# Patient Record
Sex: Male | Born: 2000 | Race: Black or African American | Hispanic: No | Marital: Single | Smoking: Current every day smoker
Health system: Southern US, Community
[De-identification: ages and names within clinical notes are randomized; demographics above are authoritative.]

## PROBLEM LIST (undated history)

## (undated) DIAGNOSIS — F329 Major depressive disorder, single episode, unspecified: Secondary | ICD-10-CM

## (undated) DIAGNOSIS — F32A Depression, unspecified: Secondary | ICD-10-CM

---
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
## 2018-05-02 ENCOUNTER — Emergency Department (HOSPITAL_COMMUNITY): Payer: Medicaid Other

## 2018-05-02 ENCOUNTER — Emergency Department (HOSPITAL_COMMUNITY)
Admission: EM | Admit: 2018-05-02 | Discharge: 2018-05-03 | Disposition: A | Discharge status: Home / Self Care | Payer: Medicaid Other | Attending: Emergency Medicine | Admitting: Emergency Medicine

## 2018-05-02 ENCOUNTER — Other Ambulatory Visit: Payer: Self-pay

## 2018-05-02 ENCOUNTER — Encounter (HOSPITAL_COMMUNITY): Payer: Self-pay | Admitting: Emergency Medicine

## 2018-05-02 DIAGNOSIS — S9032XA Contusion of left foot, initial encounter: Secondary | ICD-10-CM | POA: Insufficient documentation

## 2018-05-02 DIAGNOSIS — Y998 Other external cause status: Secondary | ICD-10-CM | POA: Diagnosis not present

## 2018-05-02 DIAGNOSIS — Y929 Unspecified place or not applicable: Secondary | ICD-10-CM | POA: Diagnosis not present

## 2018-05-02 DIAGNOSIS — S99922A Unspecified injury of left foot, initial encounter: Secondary | ICD-10-CM | POA: Diagnosis present

## 2018-05-02 DIAGNOSIS — Y9367 Activity, basketball: Secondary | ICD-10-CM | POA: Diagnosis not present

## 2018-05-02 DIAGNOSIS — W500XXA Accidental hit or strike by another person, initial encounter: Secondary | ICD-10-CM | POA: Insufficient documentation

## 2018-05-02 NOTE — ED Triage Notes (Signed)
C/o L foot pain.  States someone stepped on his L foot while playing basketball today.

## 2018-05-02 NOTE — Discharge Instructions (Signed)
You will need to alternate Tylenol and ibuprofen to help with your pain. Return to ED for worsening symptoms, red hot or tender joint, injuries or falls, worsening swelling of your foot.

## 2018-05-02 NOTE — ED Provider Notes (Signed)
[facility] EMERGENCY DEPARTMENT Provider Note   CSN: [id number] Arrival date & time: 05/02/18  2251     History   Chief Complaint Chief Complaint  Patient presents with  . Foot Pain    HPI Christopher [name] is a 17 y.o. male who presents to ED for evaluation of left foot pain for the past 10 hours.  States that he was playing basketball when someone stepped on his foot.  He has had pain since then.  Did not take any medicine prior to coming to the ED.  Denies any prior fracture, dislocations or procedures in the area.  He has been ambulatory since then.  Denies any numbness in foot, other injuries.  HPI  History reviewed. No pertinent past medical history.  There are no active problems to display for this patient.   History reviewed. No pertinent surgical history.      Home Medications    Prior to Admission medications   Medication Sig Start Date End Date Taking? Authorizing Provider  HYDROcodone-acetaminophen (NORCO/VICODIN) 5-325 MG tablet Take 1-2 tablets by mouth every 6 (six) hours as needed for severe pain. 06/01/15   Danelle Berry, PA-C  ibuprofen (ADVIL,MOTRIN) 600 MG tablet Take 1 tablet (600 mg total) by mouth every 6 (six) hours as needed. 06/09/17   Elpidio Anis, PA-C  penicillin v potassium (VEETID) 500 MG tablet Take 1 tablet (500 mg total) by mouth 3 (three) times daily. 06/09/17   Elpidio Anis, PA-C    Family History No family history on file.  Social History Social History   Tobacco Use  . Smoking status: Never Smoker  . Smokeless tobacco: Never Used  Substance Use Topics  . Alcohol use: No    Frequency: Never  . Drug use: Yes    Types: Marijuana     Allergies   Patient has no known allergies.   Review of Systems Review of Systems  Constitutional: Negative for chills and fever.  Gastrointestinal: Negative for nausea and vomiting.  Musculoskeletal: Positive for arthralgias.  Skin: Negative for wound.  Neurological:  Negative for weakness and numbness.     Physical Exam Updated Vital Signs BP 131/85 (BP Location: Right Arm)   Pulse 81   Temp 98.6 F (37 C) (Oral)   Resp 15   Ht 6' (1.829 m)   Wt 83.9 kg   SpO2 100%   BMI 25.09 kg/m   Physical Exam  Constitutional: He appears well-developed and well-nourished. No distress.  HENT:  Head: Normocephalic and atraumatic.  Eyes: Conjunctivae and EOM are normal. No scleral icterus.  Neck: Normal range of motion.  Pulmonary/Chest: Effort normal. No respiratory distress.  Musculoskeletal: He exhibits tenderness.       Feet:  Tenderness to palpation of the medial left foot.  Full active and passive range of motion of digits, ankle without difficulty.  No deformity, warmth or erythema of joints noted.  2+ DP pulse palpated.  Sensation intact to light touch of bilateral lower extremities.  Neurological: He is alert.  Skin: No rash noted. He is not diaphoretic.  Psychiatric: He has a normal mood and affect.  Nursing note and vitals reviewed.    ED Treatments / Results  Labs (all labs ordered are listed, but only abnormal results are displayed) Labs Reviewed - No data to display  EKG None  Radiology Dg Foot Complete Left  Result Date: 05/02/2018 CLINICAL DATA:  Left foot pain after injury, someone stepped on foot while playing basketball. Dorsal pain primarily  over first metatarsal. EXAM: LEFT FOOT - COMPLETE 3+ VIEW COMPARISON:  None. FINDINGS: There is no evidence of fracture or dislocation. There is no evidence of arthropathy or other focal bone abnormality. Soft tissues are unremarkable. IMPRESSION: Negative radiographs of the left foot. Electronically Signed   By: Narda Rutherford M.D.   On: 05/02/2018 23:43    Procedures Procedures (including critical care time)  Medications Ordered in ED Medications - No data to display   Initial Impression / Assessment and Plan / ED Course  I have reviewed the triage vital signs and the nursing  notes.  Pertinent labs & imaging results that were available during my care of the patient were reviewed by me and considered in my medical decision making (see chart for details).     17 year old male presents for left foot pain.  He was playing basketball when someone stepped on his foot.  This happened earlier today.  He has been ambulatory since then.  No prior fracture, dislocations or procedures in the area.  Area is neurovascularly intact.  There is tenderness to palpation on the medial left foot.  No skin changes noted.  X-rays negative.  Suspect the symptoms are due to contusion.  Doubt infectious or vascular cause.  Will give Ace wrap, advised alternating Tylenol and ibuprofen.  Advised to return to ED for any severe worsening symptoms.  Portions of this note were generated with Scientist, clinical (histocompatibility and immunogenetics). Dictation errors may occur despite best attempts at proofreading.   Final Clinical Impressions(s) / ED Diagnoses   Final diagnoses:  Contusion of left foot, initial encounter    ED Discharge Orders    None       Dietrich Pates, PA-C 05/03/18 0000    Benjiman Core, MD 05/03/18 [phone]

## 2018-09-17 ENCOUNTER — Emergency Department (HOSPITAL_COMMUNITY)
Admission: EM | Admit: 2018-09-17 | Discharge: 2018-09-17 | Disposition: A | Discharge status: Home / Self Care | Payer: Medicaid Other | Attending: Emergency Medicine | Admitting: Emergency Medicine

## 2018-09-17 ENCOUNTER — Encounter (HOSPITAL_COMMUNITY): Payer: Self-pay | Admitting: Emergency Medicine

## 2018-09-17 ENCOUNTER — Other Ambulatory Visit: Payer: Self-pay

## 2018-09-17 DIAGNOSIS — Z202 Contact with and (suspected) exposure to infections with a predominantly sexual mode of transmission: Secondary | ICD-10-CM | POA: Diagnosis not present

## 2018-09-17 DIAGNOSIS — K0889 Other specified disorders of teeth and supporting structures: Secondary | ICD-10-CM

## 2018-09-17 MED ORDER — CEFTRIAXONE SODIUM 250 MG IJ SOLR
250.0000 mg | Freq: Once | INTRAMUSCULAR | Status: AC
  Administered 2018-09-17: 250 mg via INTRAMUSCULAR
  Filled 2018-09-17: qty 250

## 2018-09-17 MED ORDER — LIDOCAINE HCL (PF) 1 % IJ SOLN
INTRAMUSCULAR | Status: AC
  Administered 2018-09-17: 2 mL
  Filled 2018-09-17: qty 5

## 2018-09-17 MED ORDER — AZITHROMYCIN 1 G PO PACK
1.0000 g | PACK | Freq: Once | ORAL | Status: AC
  Administered 2018-09-17: 1 g via ORAL
  Filled 2018-09-17: qty 1

## 2018-09-17 NOTE — ED Provider Notes (Signed)
[facility] EMERGENCY DEPARTMENT Provider Note   CSN: [id number] Arrival date & time: 09/17/18  1820    History   Chief Complaint Chief Complaint  Patient presents with  . Dental Pain  . SEXUALLY TRANSMITTED DISEASE    HPI Christopher [name] is a 18 y.o. male.     18 year old African-American male with no past medical history presenting to the emergency department for "I think I have an STD".  Patient reports he is in a monogamous relationship with 1 male.  Reports that his girlfriend told him today that she tested positive for chlamydia.  The patient is not having any symptoms.  Patient also reports that he has a broken tooth been bothering him for some time and would like it looked at.  Has not seen dentist.  Denies any fever, chills, abdominal pain, dysuria, hematuria, flank pain, penile discharge.     History reviewed. No pertinent past medical history.  There are no active problems to display for this patient.   History reviewed. No pertinent surgical history.      Home Medications    Prior to Admission medications   Medication Sig Start Date End Date Taking? Authorizing Provider  HYDROcodone-acetaminophen (NORCO/VICODIN) 5-325 MG tablet Take 1-2 tablets by mouth every 6 (six) hours as needed for severe pain. 06/01/15   Danelle Berry, PA-C  ibuprofen (ADVIL,MOTRIN) 600 MG tablet Take 1 tablet (600 mg total) by mouth every 6 (six) hours as needed. 06/09/17   Elpidio Anis, PA-C  penicillin v potassium (VEETID) 500 MG tablet Take 1 tablet (500 mg total) by mouth 3 (three) times daily. 06/09/17   Elpidio Anis, PA-C    Family History No family history on file.  Social History Social History   Tobacco Use  . Smoking status: Never Smoker  . Smokeless tobacco: Never Used  Substance Use Topics  . Alcohol use: No    Frequency: Never  . Drug use: Yes    Types: Marijuana     Allergies   Patient has no known allergies.   Review of  Systems Review of Systems  Constitutional: Negative for chills and fever.  HENT: Positive for dental problem. Negative for ear pain, facial swelling, sore throat and trouble swallowing.   Eyes: Negative for pain and visual disturbance.  Respiratory: Negative for cough and shortness of breath.   Cardiovascular: Negative for chest pain and palpitations.  Gastrointestinal: Negative for abdominal pain and vomiting.  Genitourinary: Negative.  Negative for dysuria and hematuria.  Musculoskeletal: Negative for arthralgias and back pain.  Skin: Negative for color change and rash.  Neurological: Negative for seizures and syncope.  All other systems reviewed and are negative.    Physical Exam Updated Vital Signs BP 139/72 (BP Location: Left Arm)   Pulse 71   Temp 98.4 F (36.9 C) (Oral)   Resp 16   Ht 6\' 1"  (1.854 m)   Wt 81.6 kg   SpO2 100%   BMI 23.75 kg/m   Physical Exam Vitals signs and nursing note reviewed. Exam conducted with a chaperone present.  Constitutional:      Appearance: Normal appearance.  HENT:     Head: Normocephalic.     Mouth/Throat:     Mouth: Mucous membranes are moist.     Pharynx: Oropharynx is clear. No oropharyngeal exudate.     Comments: Poor dentition throughout Eyes:     Conjunctiva/sclera: Conjunctivae normal.  Pulmonary:     Effort: Pulmonary effort is normal.  Skin:  General: Skin is dry.  Neurological:     Mental Status: He is alert.  Psychiatric:        Mood and Affect: Mood normal.      ED Treatments / Results  Labs (all labs ordered are listed, but only abnormal results are displayed) Labs Reviewed  GC/CHLAMYDIA PROBE AMP ([place]) NOT AT [facility]    EKG None  Radiology No results found.  Procedures Procedures (including critical care time)  Medications Ordered in ED Medications  cefTRIAXone (ROCEPHIN) injection 250 mg (has no administration in time range)  azithromycin (ZITHROMAX) powder 1 g (has no administration  in time range)     Initial Impression / Assessment and Plan / ED Course  I have reviewed the triage vital signs and the nursing notes.  Pertinent labs & imaging results that were available during my care of the patient were reviewed by me and considered in my medical decision making (see chart for details).          Final Clinical Impressions(s) / ED Diagnoses   Final diagnoses:  STD exposure  Pain, dental    ED Discharge Orders    None       Jeral Pinch 09/17/18 2219    Margarita Grizzle, MD 09/25/18 1705

## 2018-09-17 NOTE — Discharge Instructions (Addendum)
No sexual activity until you have been retested and your STD tests are negative. Thank you for allowing me to care for you today. Please return to the emergency department if you have new or worsening symptoms. Take your medications as instructed.

## 2018-09-17 NOTE — ED Triage Notes (Signed)
Pt. Stated, My girl friend said she had chlamydia and I also have had mouth swelling, broken tooth bottom left, for a couple of months

## 2018-09-17 NOTE — ED Notes (Signed)
Patient verbalizes understanding of discharge instructions. Opportunity for questioning and answers were provided. Armband removed by staff, pt discharged from ED.  

## 2018-09-18 LAB — GC/CHLAMYDIA PROBE AMP (~~LOC~~) NOT AT ARMC
Chlamydia: POSITIVE — AB
NEISSERIA GONORRHEA: NEGATIVE

## 2019-03-30 ENCOUNTER — Emergency Department (HOSPITAL_COMMUNITY)
Admission: EM | Admit: 2019-03-30 | Discharge: 2019-03-30 | Disposition: A | Discharge status: Home / Self Care | Payer: Medicaid Other | Attending: Emergency Medicine | Admitting: Emergency Medicine

## 2019-03-30 ENCOUNTER — Other Ambulatory Visit: Payer: Self-pay

## 2019-03-30 ENCOUNTER — Encounter (HOSPITAL_COMMUNITY): Payer: Self-pay | Admitting: Emergency Medicine

## 2019-03-30 DIAGNOSIS — K0889 Other specified disorders of teeth and supporting structures: Secondary | ICD-10-CM

## 2019-03-30 MED ORDER — PENICILLIN V POTASSIUM 500 MG PO TABS: 500.0000 mg | ORAL_TABLET | Freq: Four times a day (QID) | ORAL | 0 refills | Status: AC

## 2019-03-30 MED ORDER — NAPROXEN 500 MG PO TABS: 500.0000 mg | ORAL_TABLET | Freq: Three times a day (TID) | ORAL | 0 refills | Status: DC

## 2019-03-30 NOTE — ED Notes (Signed)
Patient Alert and oriented to baseline. Stable and ambulatory to baseline. Patient verbalized understanding of the discharge instructions.  Patient belongings were taken by the patient.   

## 2019-03-30 NOTE — Discharge Instructions (Addendum)
You were seen in the ER for dental pain.  Pain is likely from broken tooth/exposed nerve endings from cavities.  You are at higher risk for infection  Take antibiotic as prescribed. This should help with an early infection and the pain.   Dental pain is difficult to control because most of the pain is from exposed nerve endings.  Take 1000 mg of acetaminophen (Tylenol) every 6 hours.  For more pain control take ibuprofen (advil, motrin) 600 mg every 6 hours.  You can combine acetaminophen and ibuprofen as above every 6 hours for maximum pain and inflammation control.  Use pea sized amount of Orajel 15 to 20 minutes around area of pain and exposed nerves prior to eating or drinking to desensitize nerve endings.  You can apply a pea sized amount of desensitizing toothpastes (Sensodyne, Pronamel, [employer] sensitive) throughout the day to help with nerve pain and sensitivity.  Warm water and salt soaks can help with inflammation and drainage formation.  Unfortunately, dental pain will continue until you have a full dental evaluation and treatment.  See dental resources attached.  Additionally, see dentist contact information, they frequently try to accommodate patients from the ER.  Return to the ER for fevers, facial or gum line swelling, redness, pus, difficulty opening jaw.

## 2019-03-30 NOTE — ED Triage Notes (Signed)
Pt with right upper and lower dental pain on going for some time. Denies fever.

## 2019-03-30 NOTE — ED Provider Notes (Signed)
[facility]   CSN: [id number] Arrival date & time: 03/30/19  1804     History   Chief Complaint Chief Complaint  Patient presents with  . Dental Pain    HPI Christopher [name] is a 18 y.o. male presents to ER for evaluation of dental pain for 2-3 months. Located to upper and lower right side. States he has broken teeth in these areas.  Has had associated cold sensitivity that is now lingering for hours sometimes, bleeding while brushing teeth.  No interventions at home. No alleviating factors. Worse with chewing.  No associated gumline or facial swelling, fevers, trismus.      HPI  History reviewed. No pertinent past medical history.  There are no active problems to display for this patient.   History reviewed. No pertinent surgical history.      Home Medications    Prior to Admission medications   Medication Sig Start Date End Date Taking? Authorizing Provider  HYDROcodone-acetaminophen (NORCO/VICODIN) 5-325 MG tablet Take 1-2 tablets by mouth every 6 (six) hours as needed for severe pain. 06/01/15   Danelle Berryapia, Leisa, PA-C  ibuprofen (ADVIL,MOTRIN) 600 MG tablet Take 1 tablet (600 mg total) by mouth every 6 (six) hours as needed. 06/09/17   Elpidio AnisUpstill, Shari, PA-C  penicillin v potassium (VEETID) 500 MG tablet Take 1 tablet (500 mg total) by mouth 4 (four) times daily for 10 days. 03/30/19 04/09/19  Liberty HandyGibbons, Ariana Juul J, PA-C    Family History No family history on file.  Social History Social History   Tobacco Use  . Smoking status: Never Smoker  . Smokeless tobacco: Never Used  Substance Use Topics  . Alcohol use: No    Frequency: Never  . Drug use: Yes    Types: Marijuana     Allergies   Patient has no known allergies.   Review of Systems Review of Systems  HENT: Positive for dental problem.   All other systems reviewed and are negative.    Physical Exam Updated Vital Signs BP (!) 142/88 (BP Location: Right  Arm)   Pulse (!) 51   Temp 98.4 F (36.9 C) (Oral)   Resp 17   SpO2 100%   Physical Exam Constitutional:      Appearance: He is well-developed.  HENT:     Head: Normocephalic.     Comments: No maxillary/mandibular tenderness, asymmetric edema    Nose: Nose normal.     Mouth/Throat:      Comments: Pain with percussion.  Obvious broken teeth.  No localized gingival erythema, fluctuance, tenderness on exam. No abscess. Diffuse gingival hypertrophy at bases of all teeth, poor dentition throughout. Build up of white/yellow material along edges of gingiva and bases of all teeth and in between teeth.  No tenderness along gingiva, buccal mucosa, sublingual space. Normal tongue protrusion and phonation. No asymmetric facial, buccal or cheek edema. Oropharynx and tonsils normal. Eyes:     General: Lids are normal.  Neck:     Musculoskeletal: Normal range of motion.  Cardiovascular:     Rate and Rhythm: Normal rate.  Pulmonary:     Effort: Pulmonary effort is normal. No respiratory distress.  Musculoskeletal: Normal range of motion.  Neurological:     Mental Status: He is alert.  Psychiatric:        Behavior: Behavior normal.      ED Treatments / Results  Labs (all labs ordered are listed, but only abnormal results are displayed) Labs Reviewed - No  data to display  EKG None  Radiology No results found.  Procedures Procedures (including critical care time)  Medications Ordered in ED Medications - No data to display   Initial Impression / Assessment and Plan / ED Course  I have reviewed the triage vital signs and the nursing notes.  Pertinent labs & imaging results that were available during my care of the patient were reviewed by me and considered in my medical decision making (see chart for details).   Highest on ddx is dentin/nerve exposure from cracked teeth causing nerve irritation.  No signs of dental abscess on exam.  Afebrile, non toxic appearing, swallowing  secretions well without hot potato voice, drooling, trismus, asymmetric facial or mandibular or maxillary edema. Clinically this is not consistent with Ludwig's angina, dental or facial infectious process or abscess, or other deep tissue infection in neck, jaw, face, throat.  Will treat with penicillin, NSAIDs, warm salt soaks.  Encouraged patient to follow-up with dentist.  Patient given dentist contact info, encouraged to follow up for ultimate management of dental pain and overall dental health. Strict ED return precautions given. Pt is aware of red flag symptoms that would warrant return to ED for re-evaluation and further treatment. Patient voices understanding and is agreeable to plan.  Final Clinical Impressions(s) / ED Diagnoses   Final diagnoses:  Pain, dental    ED Discharge Orders         Ordered    penicillin v potassium (VEETID) 500 MG tablet  4 times daily     03/30/19 2047    naproxen (NAPROSYN) 500 MG tablet  3 times daily with meals,   Status:  Discontinued     03/30/19 2047           Kinnie Feil, PA-C 03/30/19 2116    Quintella Reichert, MD 03/31/19 [phone]

## 2019-08-02 ENCOUNTER — Emergency Department (HOSPITAL_COMMUNITY)
Admission: EM | Admit: 2019-08-02 | Discharge: 2019-08-03 | Disposition: A | Discharge status: Home / Self Care | Payer: Medicaid Other | Attending: Emergency Medicine | Admitting: Emergency Medicine

## 2019-08-02 ENCOUNTER — Other Ambulatory Visit: Payer: Self-pay

## 2019-08-02 ENCOUNTER — Encounter (HOSPITAL_COMMUNITY): Payer: Self-pay | Admitting: Emergency Medicine

## 2019-08-02 DIAGNOSIS — R45850 Homicidal ideations: Secondary | ICD-10-CM | POA: Diagnosis not present

## 2019-08-02 DIAGNOSIS — Z20822 Contact with and (suspected) exposure to covid-19: Secondary | ICD-10-CM | POA: Insufficient documentation

## 2019-08-02 DIAGNOSIS — F17290 Nicotine dependence, other tobacco product, uncomplicated: Secondary | ICD-10-CM | POA: Insufficient documentation

## 2019-08-02 DIAGNOSIS — F331 Major depressive disorder, recurrent, moderate: Secondary | ICD-10-CM | POA: Insufficient documentation

## 2019-08-02 DIAGNOSIS — F329 Major depressive disorder, single episode, unspecified: Secondary | ICD-10-CM | POA: Diagnosis present

## 2019-08-02 LAB — COMPREHENSIVE METABOLIC PANEL
ALT: 20 U/L (ref 0–44)
AST: 20 U/L (ref 15–41)
Albumin: 5 g/dL (ref 3.5–5.0)
Alkaline Phosphatase: 61 U/L (ref 38–126)
Anion gap: 12 (ref 5–15)
BUN: 18 mg/dL (ref 6–20)
CO2: 24 mmol/L (ref 22–32)
Calcium: 9.7 mg/dL (ref 8.9–10.3)
Chloride: 101 mmol/L (ref 98–111)
Creatinine, Ser: 1.04 mg/dL (ref 0.61–1.24)
GFR calc Af Amer: >60 mL/min (ref >60)
GFR calc non Af Amer: >60 mL/min (ref >60)
Glucose, Bld: 77 mg/dL (ref 70–99)
Potassium: 3.7 mmol/L (ref 3.5–5.1)
Sodium: 137 mmol/L (ref 135–145)
Total Bilirubin: 0.9 mg/dL (ref 0.3–1.2)
Total Protein: 8.3 g/dL — ABNORMAL HIGH (ref 6.5–8.1)

## 2019-08-02 LAB — RAPID URINE DRUG SCREEN, HOSP PERFORMED
Amphetamines: NOT DETECTED
Barbiturates: NOT DETECTED
Benzodiazepines: NOT DETECTED
Cocaine: NOT DETECTED
Opiates: NOT DETECTED
Tetrahydrocannabinol: POSITIVE — AB

## 2019-08-02 LAB — CBC
HCT: 50.6 % (ref 39.0–52.0)
Hemoglobin: 17 g/dL (ref 13.0–17.0)
MCH: 30.9 pg (ref 26.0–34.0)
MCHC: 33.6 g/dL (ref 30.0–36.0)
MCV: 92 fL (ref 80.0–100.0)
Platelets: 281 10*3/uL (ref 150–400)
RBC: 5.5 MIL/uL (ref 4.22–5.81)
RDW: 12.4 % (ref 11.5–15.5)
WBC: 11.5 10*3/uL — ABNORMAL HIGH (ref 4.0–10.5)
nRBC: 0 % (ref 0.0–0.2)

## 2019-08-02 LAB — SALICYLATE LEVEL: Salicylate Lvl: <7 mg/dL — ABNORMAL LOW (ref 7.0–30.0)

## 2019-08-02 LAB — ACETAMINOPHEN LEVEL: Acetaminophen (Tylenol), Serum: <10 ug/mL — ABNORMAL LOW (ref 10–30)

## 2019-08-02 LAB — ETHANOL: Alcohol, Ethyl (B): <10 mg/dL (ref <10)

## 2019-08-02 MED ORDER — ALUM & MAG HYDROXIDE-SIMETH 200-200-20 MG/5ML PO SUSP: 30.0000 mL | Freq: Four times a day (QID) | ORAL | Status: DC | PRN

## 2019-08-02 MED ORDER — ACETAMINOPHEN 325 MG PO TABS: 650.0000 mg | ORAL_TABLET | ORAL | Status: DC | PRN

## 2019-08-02 NOTE — Progress Notes (Signed)
Nira Conn, NP recommends overnight OBS pending AM psych assessment for safety and stabilization. EDP Aberman, Merla Riches, PA-C and Rockney Ghee, RN have been advised.  Princess Bruins, MSW, LCSW Therapeutic Triage Specialist  [phone]

## 2019-08-02 NOTE — ED Notes (Addendum)
Pt changed into burgundy scrubs and belongings placed in pt belonging bag.  Pt had 3 pt belonging bags and they were labeled with pt labels and placed behind nurses station.

## 2019-08-02 NOTE — ED Provider Notes (Signed)
[name] DEPT Provider Note   CSN: [id number] Arrival date & time: 08/02/19  2008     History Chief Complaint  Patient presents with  . Homicidal    Christopher [name] is a 19 y.o. male with no significant past medical history who presents to the ED due feelings of sadness and homicidal ideations. Patient notes his grandfather passed away this summer and he has progressively become more depressed. He admits to homicidal ideations, but notes he will not hurt anyone here in the hospital "because these people haven't done anything to me". Patient denies SI. Denies visual and auditory hallucinations. Patient admits to smoking marijuana daily, but denies other drug usage. Also admits to smoking black and milds daily. He notes he drinks alcohol occasionally and takes "a few shots" a week. Patient states that he usually talks to his girlfriend about his issues, but hasn't spoken to her in the past 3 days because she wants to focus on her schoolwork. Patient denies any suicide attempts. No history of self harm. Patient states he has never been diagnosed with mental illness. Patient has no other complaints at this time.      History reviewed. No pertinent past medical history.  There are no problems to display for this patient.   History reviewed. No pertinent surgical history.     History reviewed. No pertinent family history.  Social History   Tobacco Use  . Smoking status: Current Every Day Smoker    Types: Cigars  . Smokeless tobacco: Never Used  Substance Use Topics  . Alcohol use: No  . Drug use: Yes    Types: Marijuana    Home Medications Prior to Admission medications   Not on File    Allergies    Patient has no known allergies.  Review of Systems   Review of Systems  Constitutional: Negative for chills and fever.  Respiratory: Negative for cough and shortness of breath.   Cardiovascular: Negative for chest pain.  Gastrointestinal:  Negative for abdominal pain, diarrhea, nausea and vomiting.  Neurological: Negative for headaches.  Psychiatric/Behavioral: Positive for behavioral problems (homicidal). Negative for self-injury and suicidal ideas.  All other systems reviewed and are negative.   Physical Exam Updated Vital Signs BP 131/89 (BP Location: Left Arm)   Pulse 86   Temp 98.3 F (36.8 C) (Oral)   Resp 17   Ht 6' (1.829 m)   Wt 79.4 kg   SpO2 100%   BMI 23.73 kg/m   Physical Exam Vitals and nursing note reviewed.  Constitutional:      General: He is not in acute distress. HENT:     Head: Normocephalic.  Eyes:     Pupils: Pupils are equal, round, and reactive to light.  Cardiovascular:     Rate and Rhythm: Normal rate and regular rhythm.     Pulses: Normal pulses.     Heart sounds: Normal heart sounds. No murmur. No friction rub. No gallop.   Pulmonary:     Effort: Pulmonary effort is normal.     Breath sounds: Normal breath sounds.  Abdominal:     General: Abdomen is flat. There is no distension.     Palpations: Abdomen is soft.     Tenderness: There is no abdominal tenderness. There is no guarding or rebound.  Musculoskeletal:     Cervical back: Neck supple.     Right lower leg: No edema.     Left lower leg: No edema.     Comments:  Able to move all 4 extremities without difficulty. No lower extremity edema.   Skin:    General: Skin is warm.  Neurological:     General: No focal deficit present.  Psychiatric:        Mood and Affect: Mood is depressed.        Thought Content: Thought content includes homicidal ideation.     ED Results / Procedures / Treatments   Labs (all labs ordered are listed, but only abnormal results are displayed) Labs Reviewed  COMPREHENSIVE METABOLIC PANEL - Abnormal; Notable for the following components:      Result Value   Total Protein 8.3 (*)    All other components within normal limits  SALICYLATE LEVEL - Abnormal; Notable for the following components:     Salicylate Lvl <7.0 (*)    All other components within normal limits  ACETAMINOPHEN LEVEL - Abnormal; Notable for the following components:   Acetaminophen (Tylenol), Serum <10 (*)    All other components within normal limits  CBC - Abnormal; Notable for the following components:   WBC 11.5 (*)    All other components within normal limits  RAPID URINE DRUG SCREEN, HOSP PERFORMED - Abnormal; Notable for the following components:   Tetrahydrocannabinol POSITIVE (*)    All other components within normal limits  RESPIRATORY PANEL BY RT PCR (FLU A&B, COVID)  ETHANOL    EKG None  Radiology No results found.  Procedures Procedures (including critical care time)  Medications Ordered in ED Medications - No data to display  ED Course  I have reviewed the triage vital signs and the nursing notes.  Pertinent labs & imaging results that were available during my care of the patient were reviewed by me and considered in my medical decision making (see chart for details).    MDM Rules/Calculators/A&P                     19 year old male presents to the ED due to feelings of depression and homicidal thoughts. Patient is here voluntarily. Patient notes he has felt this way since his grandfather passed away over the summer. Vitals all within normal limits. Patient in no acute distress and non-ill appearing. Physical exam completely unremarkable. Will order behavioral clearance labs.  CMP reassuring with normal renal function and no electrolyte abnormalities. Acetaminophen and salicylate levels within normal limits. Ethanol level normal. UDS positive for THC. CBC reassuring with a mild leukocytosis of 11.5. CMP reassuring with normal renal function and no electrolyte abnormalities. Patient has been medically cleared for TTS assessment. Patient placed in psych hold. No home medications to order at this time.  Final Clinical Impression(s) / ED Diagnoses Final diagnoses:  Homicidal ideation     Rx / DC Orders ED Discharge Orders    None       Jesusita Oka 08/02/19 2320    Charlynne Pander, MD 08/05/19 [phone]

## 2019-08-02 NOTE — Progress Notes (Signed)
Pt tentatively accepted to [facility] OBS 403 to Dr. Lucianne Muss, MD pending negative Covid test. Call to report [phone]. Rockney Ghee, RN has been advised.   Princess Bruins, MSW, LCSW Therapeutic Triage Specialist  [phone]
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
## 2019-08-02 NOTE — ED Triage Notes (Signed)
Patient states that his grandfather died in the summer. Since then patient states he has been depressed and wants to kill people. Patient states he is not suicidal.

## 2019-08-03 ENCOUNTER — Encounter (HOSPITAL_COMMUNITY): Payer: Self-pay | Admitting: Nurse Practitioner

## 2019-08-03 ENCOUNTER — Inpatient Hospital Stay (HOSPITAL_COMMUNITY)
Admission: AD | Admit: 2019-08-03 | Discharge: 2019-08-06 | DRG: 882 | Disposition: A | Discharge status: Home / Self Care | Payer: Medicaid Other | Source: Intra-hospital | Attending: Psychiatry | Admitting: Psychiatry

## 2019-08-03 DIAGNOSIS — F17290 Nicotine dependence, other tobacco product, uncomplicated: Secondary | ICD-10-CM | POA: Diagnosis present

## 2019-08-03 DIAGNOSIS — F331 Major depressive disorder, recurrent, moderate: Secondary | ICD-10-CM | POA: Diagnosis not present

## 2019-08-03 DIAGNOSIS — R45851 Suicidal ideations: Secondary | ICD-10-CM | POA: Diagnosis present

## 2019-08-03 DIAGNOSIS — F411 Generalized anxiety disorder: Secondary | ICD-10-CM | POA: Diagnosis present

## 2019-08-03 DIAGNOSIS — F1290 Cannabis use, unspecified, uncomplicated: Secondary | ICD-10-CM | POA: Diagnosis not present

## 2019-08-03 DIAGNOSIS — F323 Major depressive disorder, single episode, severe with psychotic features: Secondary | ICD-10-CM | POA: Diagnosis not present

## 2019-08-03 DIAGNOSIS — F600 Paranoid personality disorder: Secondary | ICD-10-CM | POA: Diagnosis present

## 2019-08-03 DIAGNOSIS — R45850 Homicidal ideations: Secondary | ICD-10-CM | POA: Diagnosis present

## 2019-08-03 DIAGNOSIS — G4700 Insomnia, unspecified: Secondary | ICD-10-CM | POA: Diagnosis not present

## 2019-08-03 DIAGNOSIS — F4323 Adjustment disorder with mixed anxiety and depressed mood: Principal | ICD-10-CM | POA: Diagnosis present

## 2019-08-03 LAB — RESPIRATORY PANEL BY RT PCR (FLU A&B, COVID)
Influenza A by PCR: NEGATIVE
Influenza B by PCR: NEGATIVE
SARS Coronavirus 2 by RT PCR: NEGATIVE

## 2019-08-03 MED ORDER — HYDROXYZINE HCL 25 MG PO TABS
25.0000 mg | ORAL_TABLET | Freq: Three times a day (TID) | ORAL | Status: DC | PRN
  Administered 2019-08-03 (×2): 25 mg via ORAL
  Filled 2019-08-03 (×2): qty 1

## 2019-08-03 MED ORDER — TRAZODONE HCL 50 MG PO TABS: 50.0000 mg | ORAL_TABLET | Freq: Every evening | ORAL | Status: DC | PRN

## 2019-08-03 MED ORDER — ACETAMINOPHEN 325 MG PO TABS
650.0000 mg | ORAL_TABLET | Freq: Four times a day (QID) | ORAL | Status: DC | PRN
  Administered 2019-08-05: 650 mg via ORAL
  Filled 2019-08-03: qty 2

## 2019-08-03 MED ORDER — QUETIAPINE FUMARATE 25 MG PO TABS
25.0000 mg | ORAL_TABLET | ORAL | Status: AC
  Administered 2019-08-03: 25 mg via ORAL
  Filled 2019-08-03 (×2): qty 1

## 2019-08-03 MED ORDER — ALUM & MAG HYDROXIDE-SIMETH 200-200-20 MG/5ML PO SUSP: 30.0000 mL | ORAL | Status: DC | PRN

## 2019-08-03 MED ORDER — SERTRALINE HCL 25 MG PO TABS
25.0000 mg | ORAL_TABLET | Freq: Every day | ORAL | Status: DC
  Administered 2019-08-03 – 2019-08-04 (×2): 25 mg via ORAL
  Filled 2019-08-03 (×5): qty 1

## 2019-08-03 MED ORDER — MAGNESIUM HYDROXIDE 400 MG/5ML PO SUSP: 30.0000 mL | Freq: Every day | ORAL | Status: DC | PRN

## 2019-08-03 MED ORDER — QUETIAPINE FUMARATE 100 MG PO TABS
100.0000 mg | ORAL_TABLET | Freq: Every day | ORAL | Status: DC
  Administered 2019-08-03 – 2019-08-05 (×3): 100 mg via ORAL
  Filled 2019-08-03 (×5): qty 1

## 2019-08-03 MED ORDER — NICOTINE 14 MG/24HR TD PT24
14.0000 mg | MEDICATED_PATCH | Freq: Every day | TRANSDERMAL | Status: DC
  Administered 2019-08-04 – 2019-08-06 (×3): 14 mg via TRANSDERMAL
  Filled 2019-08-03 (×5): qty 1

## 2019-08-03 NOTE — BH Assessment (Signed)
[facility] Assessment Progress Note  Per Malvin Johns, MD, this voluntary pt requires psychiatric hospitalization at this time.  Jasmine has assigned pt to [facility] Rm 307.  Pt's nurse, Lanora Manis, has been notified.  Doylene Canning, [place] Behavioral Health Coordinator [phone]

## 2019-08-03 NOTE — [facility] Group Notes (Signed)
LCSW Group Therapy Note 08/03/2019 2:51 PM  Type of Therapy and Topic: Group Therapy: Overcoming Obstacles  Participation Level: Active  Description of Group:  In this group patients will be encouraged to explore what they see as obstacles to their own wellness and recovery. They will be guided to discuss their thoughts, feelings, and behaviors related to these obstacles. The group will process together ways to cope with barriers, with attention given to specific choices patients can make. Each patient will be challenged to identify changes they are motivated to make in order to overcome their obstacles. This group will be process-oriented, with patients participating in exploration of their own experiences as well as giving and receiving support and challenge from other group members.  Therapeutic Goals: 1. Patient will identify personal and current obstacles as they relate to admission. 2. Patient will identify barriers that currently interfere with their wellness or overcoming obstacles.  3. Patient will identify feelings, thought process and behaviors related to these barriers. 4. Patient will identify two changes they are willing to make to overcome these obstacles:   Summary of Patient Progress  Christopher [name] was engaged and participated throughout the group session. Christopher [name] reports he did not have any obstacles currently. Christopher [name] states his only obstacle is being " a Black man".    Therapeutic Modalities:  Cognitive Behavioral Therapy Solution Focused Therapy Motivational Interviewing Relapse Prevention Therapy   Alcario Drought Clinical Social Worker

## 2019-08-03 NOTE — Progress Notes (Signed)
Dar Note: Patient presents with anxious affect and mood.  Denies suicidal thoughts, auditory and visual hallucinations.  Medication given as prescribed.  Routine safety checks maintained.  Patient transferred to the 300 hall for inpatient admission.  Report given to Kelton Pillar., RN.  Patient oriented to the unit, staff and room.

## 2019-08-03 NOTE — ED Notes (Signed)
[facility] notified of need for transport of pt to [facility].

## 2019-08-03 NOTE — H&P (Signed)
BH Observation Unit Provider Admission PAA/H&P  Patient Identification: Pavan Bring MRN:  [id number] Date of Evaluation:  08/03/2019 Chief Complaint:  MDD Principal Diagnosis: Adjustment disorder with mixed anxiety and depressed mood Diagnosis:  Principal Problem:   Adjustment disorder with mixed anxiety and depressed mood Active Problems:   Homicidal ideations  History of Present Illness:  TTS Assessment:  Christopher [name] is an 19 y.o. male who presents to the ED voluntarily. Pt reports he has been experiencing HI for the past week. Pt denies that he has anyone in particular that he wants to harm. Pt states he would kill whoever he needed to kill. Pt states he has been depressed since his grandfather passed away over the summer. Pt states he has no one to talk to and reports he tried to talk to his girlfriend about his feelings but she brushed him off. Pt states he has been experiencing crying spells, isolation, feeling low, loss of appetite, and insomnia. Pt states he has been feeling paranoid that people are trying to kill him and his family. Pt states he has been feeling this way ever since he joined a gang. Pt provides consent for TTS to speak with his father Cope, Marte) at [phone]. Dad reports the pt has been distant and isolating himself recently and has expressed frustration that he has pending legal charges. Dad reports the pt has not been able to complete his community service due to Covid and the pt has been worried about his upcoming court date.   Evaluation on Unit: Reviewed TTS assessment and validated with patient. On evaluation patient is alert and oriented x 4, pleasant, and cooperative. Speech is clear and coherent. Mood is anxious/depressed and affect is congruent with mood. Thought process is coherent and thought content is illogical. Reports feeling paranoid that people are trying to kill his family.  Denies audiovisual hallucinations. No indication that patient is  responding to internal stimuli. Denies suicidal ideations. Reports homicidal ideations towards people in general. Denies a specific plan. Reports marijuana use, Denies use of other substances. Reports that he has a court date on 07/2019 for larceny or trespassing.   Associated Signs/Symptoms: Depression Symptoms:  depressed mood, insomnia, feelings of worthlessness/guilt, anxiety, decreased appetite, (Hypo) Manic Symptoms:  Irritable Mood, Anxiety Symptoms:  general anxiety Psychotic Symptoms:  deneis, none noted PTSD Symptoms: Negative Total Time spent with patient: 20 minutes  Past Psychiatric History: Denies  Is the patient at risk to self? No.  Has the patient been a risk to self in the past 6 months? No.  Has the patient been a risk to self within the distant past? No.  Is the patient a risk to others? Yes.    Has the patient been a risk to others in the past 6 months? No.  Has the patient been a risk to others within the distant past? No.   Prior Inpatient Therapy:   Prior Outpatient Therapy:    Alcohol Screening:   Substance Abuse History in the last 12 months:  Yes.   Consequences of Substance Abuse: Negative Previous Psychotropic Medications: No  Psychological Evaluations: No  Past Medical History: No past medical history on file. No past surgical history on file. Family History: No family history on file. Family Psychiatric History: No pertinent history Tobacco Screening:   Social History:  Social History   Substance and Sexual Activity  Alcohol Use No     Social History   Substance and Sexual Activity  Drug Use Yes  . Types: Marijuana  Additional Social History:                           Allergies:  No Known Allergies Lab Results:  Results for orders placed or performed during the hospital encounter of 08/02/19 (from the past 48 hour(s))  Comprehensive metabolic panel     Status: Abnormal   Collection Time: 08/02/19  8:49 PM  Result  Value Ref Range   Sodium 137 135 - 145 mmol/L   Potassium 3.7 3.5 - 5.1 mmol/L   Chloride 101 98 - 111 mmol/L   CO2 24 22 - 32 mmol/L   Glucose, Bld 77 70 - 99 mg/dL   BUN 18 6 - 20 mg/dL   Creatinine, Ser 0.60 0.61 - 1.24 mg/dL   Calcium 9.7 8.9 - 15.6 mg/dL   Total Protein 8.3 (H) 6.5 - 8.1 g/dL   Albumin 5.0 3.5 - 5.0 g/dL   AST 20 15 - 41 U/L   ALT 20 0 - 44 U/L   Alkaline Phosphatase 61 38 - 126 U/L   Total Bilirubin 0.9 0.3 - 1.2 mg/dL   GFR calc non Af Amer >60 >60 mL/min   GFR calc Af Amer >60 >60 mL/min   Anion gap 12 5 - 15    Comment: Performed at [facility], [address], [place], [place] [address]  Ethanol     Status: None   Collection Time: 08/02/19  8:49 PM  Result Value Ref Range   Alcohol, Ethyl (B) <10 <10 mg/dL    Comment: (NOTE) Lowest detectable limit for serum alcohol is 10 mg/dL. For medical purposes only. Performed at [facility], [address] [place], [place] [address]   Salicylate level     Status: Abnormal   Collection Time: 08/02/19  8:49 PM  Result Value Ref Range   Salicylate Lvl <7.0 (L) 7.0 - 30.0 mg/dL    Comment: Performed at [facility], [address], [place], [place] [address]  Acetaminophen level     Status: Abnormal   Collection Time: 08/02/19  8:49 PM  Result Value Ref Range   Acetaminophen (Tylenol), Serum <10 (L) 10 - 30 ug/mL    Comment: (NOTE) Therapeutic concentrations vary significantly. A range of 10-30 ug/mL  may be an effective concentration for many patients. However, some  are best treated at concentrations outside of this range. Acetaminophen concentrations >150 ug/mL at 4 hours after ingestion  and >50 ug/mL at 12 hours after ingestion are often associated with  toxic reactions. Performed at [facility], [address] [place], [place] [address]   cbc     Status: Abnormal   Collection Time: 08/02/19  8:49 PM  Result Value Ref  Range   WBC 11.5 (H) 4.0 - 10.5 K/uL   RBC 5.50 4.22 - 5.81 MIL/uL   Hemoglobin 17.0 13.0 - 17.0 g/dL   HCT 73.4 03.7 - 09.6 %   MCV 92.0 80.0 - 100.0 fL   MCH 30.9 26.0 - 34.0 pg   MCHC 33.6 30.0 - 36.0 g/dL   RDW 43.8 38.1 - 84.0 %   Platelets 281 150 - 400 K/uL   nRBC 0.0 0.0 - 0.2 %    Comment: Performed at [facility], [address], [place], [place] [address]  Rapid urine drug screen (hospital performed)     Status: Abnormal   Collection Time: 08/02/19  8:49 PM  Result  Value Ref Range   Opiates NONE DETECTED NONE DETECTED   Cocaine NONE DETECTED NONE DETECTED   Benzodiazepines NONE DETECTED NONE DETECTED   Amphetamines NONE DETECTED NONE DETECTED   Tetrahydrocannabinol POSITIVE (A) NONE DETECTED   Barbiturates NONE DETECTED NONE DETECTED    Comment: (NOTE) DRUG SCREEN FOR MEDICAL PURPOSES ONLY.  IF CONFIRMATION IS NEEDED FOR ANY PURPOSE, NOTIFY LAB WITHIN 5 DAYS. LOWEST DETECTABLE LIMITS FOR URINE DRUG SCREEN Drug Class                     Cutoff (ng/mL) Amphetamine and metabolites    1000 Barbiturate and metabolites    200 Benzodiazepine                 200 Tricyclics and metabolites     300 Opiates and metabolites        300 Cocaine and metabolites        300 THC                            50 Performed at [facility], [address] [place], [place] [id number]   Respiratory Panel by RT PCR (Flu A&B, Covid) - Nasopharyngeal Swab     Status: None   Collection Time: 08/03/19 12:15 AM   Specimen: Nasopharyngeal Swab  Result Value Ref Range   SARS Coronavirus 2 by RT PCR NEGATIVE NEGATIVE    Comment: (NOTE) SARS-CoV-2 target nucleic acids are NOT DETECTED. The SARS-CoV-2 RNA is generally detectable in upper respiratoy specimens during the acute phase of infection. The lowest concentration of SARS-CoV-2 viral copies this assay can detect is 131 copies/mL. A negative result does not preclude SARS-Cov-2 infection and should  not be used as the sole basis for treatment or other patient management decisions. A negative result may occur with  improper specimen collection/handling, submission of specimen other than nasopharyngeal swab, presence of viral mutation(s) within the areas targeted by this assay, and inadequate number of viral copies (<131 copies/mL). A negative result must be combined with clinical observations, patient history, and epidemiological information. The expected result is Negative. Fact Sheet for Patients:  https://www.moore.com/https://www.fda.gov/media/142436/download Fact Sheet for Healthcare Providers:  https://www.young.biz/https://www.fda.gov/media/142435/download This test is not yet ap proved or cleared by the [place] FDA and  has been authorized for detection and/or diagnosis of SARS-CoV-2 by FDA under an Emergency Use Authorization (EUA). This EUA will remain  in effect (meaning this test can be used) for the duration of the COVID-19 declaration under Section 564(b)(1) of the Act, 21 U.S.C. section 360bbb-3(b)(1), unless the authorization is terminated or revoked sooner.    Influenza A by PCR NEGATIVE NEGATIVE   Influenza B by PCR NEGATIVE NEGATIVE    Comment: (NOTE) The Xpert Xpress SARS-CoV-2/FLU/RSV assay is intended as an aid in  the diagnosis of influenza from Nasopharyngeal swab specimens and  should not be used as a sole basis for treatment. Nasal washings and  aspirates are unacceptable for Xpert Xpress SARS-CoV-2/FLU/RSV  testing. Fact Sheet for Patients: https://www.moore.com/https://www.fda.gov/media/142436/download Fact Sheet for Healthcare Providers: https://www.young.biz/https://www.fda.gov/media/142435/download This test is not yet approved or cleared by the [place] FDA and  has been authorized for detection and/or diagnosis of SARS-CoV-2 by  FDA under an Emergency Use Authorization (EUA). This EUA will remain  in effect (meaning this test can be used) for the duration of the  Covid-19 declaration under Section 564(b)(1) of the Act,  21  U.S.C. section 360bbb-3(b)(1),  unless the authorization is  terminated or revoked. Performed at [facility], [address], [place], [place] [address]     Blood Alcohol level:  Lab Results  Component Value Date   ETH <10 27/74/1287    Metabolic Disorder Labs:  No results found for: HGBA1C, MPG No results found for: PROLACTIN No results found for: CHOL, TRIG, HDL, CHOLHDL, VLDL, LDLCALC  Current Medications: No current facility-administered medications for this encounter.   No current outpatient medications on file.   Facility-Administered Medications Ordered in Other Encounters  Medication Dose Route Frequency Provider Last Rate Last Admin  . acetaminophen (TYLENOL) tablet 650 mg  650 mg Oral Q4H PRN Aberman, Caroline C, PA-C      . alum & mag hydroxide-simeth (MAALOX/MYLANTA) 200-200-20 MG/5ML suspension 30 mL  30 mL Oral Q6H PRN Charmaine Downs C, PA-C       PTA Medications: No medications prior to admission.    Musculoskeletal: Strength & Muscle Tone: within normal limits Gait & Station: normal Patient leans: N/A  Psychiatric Specialty Exam: Physical Exam  Constitutional: He is oriented to person, place, and time. He appears well-developed and well-nourished. No distress.  HENT:  Head: Normocephalic and atraumatic.  Right Ear: External ear normal.  Left Ear: External ear normal.  Eyes: Pupils are equal, round, and reactive to light.  Respiratory: Effort normal. No respiratory distress.  Musculoskeletal:        General: Normal range of motion.  Neurological: He is alert and oriented to person, place, and time.  Skin: He is not diaphoretic.  Psychiatric: His mood appears anxious. He is not withdrawn and not actively hallucinating. Thought content is not paranoid and not delusional. He expresses impulsivity and inappropriate judgment. He exhibits a depressed mood. He expresses homicidal ideation. He expresses no suicidal ideation. He  expresses no homicidal plans.    Review of Systems  Constitutional: Negative.   Respiratory: Negative.   Cardiovascular: Negative.   Gastrointestinal: Negative.   Musculoskeletal: Negative.   Neurological: Negative.   Psychiatric/Behavioral: Positive for sleep disturbance. Negative for suicidal ideas. The patient is nervous/anxious.     Blood pressure 135/68, pulse 84, temperature (!) 97.5 F (36.4 C), temperature source Oral, resp. rate 18, height 6' (1.829 m), weight 72.1 kg, SpO2 100 %.There is no height or weight on file to calculate BMI.  General Appearance: Casual and Fairly Groomed  Eye Contact:  Fair  Speech:  Clear and Coherent and Normal Rate  Volume:  Decreased  Mood:  Anxious and Depressed  Affect:  Congruent and Depressed  Thought Process:  Coherent, Goal Directed, Linear and Descriptions of Associations: Intact  Orientation:  Full (Time, Place, and Person)  Thought Content:  Logical and Hallucinations: None  Suicidal Thoughts:  No  Homicidal Thoughts:  Yes.  without intent/plan  Memory:  Immediate;   Good  Judgement:  Fair  Insight:  Fair  Psychomotor Activity:  Normal  Concentration:  Concentration: Good  Recall:  Good  Fund of Knowledge:  Good  Language:  Good  Akathisia:  Negative  Handed:  Right  AIMS (if indicated):     Assets:  Communication Skills Housing Leisure Time Physical Health  ADL's:  Intact  Cognition:  WNL  Sleep:         Treatment Plan Summary: Daily contact with patient to assess and evaluate symptoms and progress in treatment and Medication management  Observation Level/Precautions:  15 minute checks Laboratory:  See ED labs Psychotherapy:  Individual Medications:  Hydroxyzine 25 mg TID prn for anxiety Consultations:  social work Discharge Concerns:  safety Estimated LOS: Other:      Jackelyn Poling, NP 1/18/20212:47 AM

## 2019-08-03 NOTE — H&P (Signed)
Psychiatric Admission Assessment Adult  Patient Identification: Christopher [name] MRN:  [id number] Date of Evaluation:  08/03/2019 Chief Complaint:  Adjustment disorder with mixed anxiety and depressed mood [F43.23] Principal Diagnosis: Adjustment disorder with mixed anxiety and depressed mood Diagnosis:  Principal Problem:   Adjustment disorder with mixed anxiety and depressed mood Active Problems:   Homicidal ideations  History of Present Illness: Patient is seen and examined. Patient is a 19 year old male with a negative past psychiatric history up to this point who presented to the [facility] emergency department on 08/02/2019 secondary to homicidal ideation. The patient was significantly tearful throughout the interview. The patient stated that he had been depressed since his grandfather died last summer. The patient stated that he is also upset because "everybody lies to me". He has tried to talk to his girlfriend, but she is brushed them off. He admitted to crying spells, helplessness, hopelessness, worthlessness and insomnia. He also felt as though people were trying to harm him. He stated he is felt that way ever since he joined a gang. Collateral information from his father suggested that he had been distant and isolating himself recently. He also has a court date sometime at the end of this month for larceny and trespassing. He denied any previous attempts to harm himself. He denied any previous psychiatric medications, he denied any previous psychiatric admissions. He stated his family brought him to the emergency room because "all of them had been admitted before for similar reasons". He was admitted to the hospital for evaluation and stabilization.  Associated Signs/Symptoms: Depression Symptoms:  depressed mood, anhedonia, insomnia, psychomotor agitation, fatigue, feelings of worthlessness/guilt, difficulty concentrating, hopelessness, suicidal thoughts without  plan, anxiety, loss of energy/fatigue, disturbed sleep, (Hypo) Manic Symptoms:  Impulsivity, Irritable Mood, Labiality of Mood, Anxiety Symptoms:  Excessive Worry, Psychotic Symptoms:  Hallucinations: Auditory PTSD Symptoms: Negative Total Time spent with patient: 30 minutes  Past Psychiatric History: Patient denied any previous psychiatric evaluations, previous psychiatric medications, or previous psychiatric admissions.  Is the patient at risk to self? Yes.    Has the patient been a risk to self in the past 6 months? Yes.    Has the patient been a risk to self within the distant past? No.  Is the patient a risk to others? Yes.    Has the patient been a risk to others in the past 6 months? Yes.    Has the patient been a risk to others within the distant past? No.   Prior Inpatient Therapy:   Prior Outpatient Therapy:    Alcohol Screening: 1. How often do you have a drink containing alcohol?: Never 2. How many drinks containing alcohol do you have on a typical day when you are drinking?: 1 or 2 3. How often do you have six or more drinks on one occasion?: Never AUDIT-C Score: 0 4. How often during the last year have you found that you were not able to stop drinking once you had started?: Never 5. How often during the last year have you failed to do what was normally expected from you becasue of drinking?: Never 6. How often during the last year have you needed a first drink in the morning to get yourself going after a heavy drinking session?: Never 7. How often during the last year have you had a feeling of guilt of remorse after drinking?: Never 8. How often during the last year have you been unable to remember what happened the night before because you  had been drinking?: Never 9. Have you or someone else been injured as a result of your drinking?: No 10. Has a relative or friend or a doctor or another health worker been concerned about your drinking or suggested you cut down?:  No Alcohol Use Disorder Identification Test Final Score (AUDIT): 0 Substance Abuse History in the last 12 months:  Yes.   Consequences of Substance Abuse: Negative Previous Psychotropic Medications: No  Psychological Evaluations: No  Past Medical History: History reviewed. No pertinent past medical history. History reviewed. No pertinent surgical history. Family History: History reviewed. No pertinent family history. Family Psychiatric  History: Patient stated that several of his family members have been admitted to this facility. He stated that his mother was schizophrenic. His sister is on disability for psychiatric reasons. One of his sisters may have bipolar disorder. Tobacco Screening:   Social History:  Social History   Substance and Sexual Activity  Alcohol Use No     Social History   Substance and Sexual Activity  Drug Use Yes  . Types: Marijuana    Additional Social History:                           Allergies:  No Known Allergies Lab Results:  Results for orders placed or performed during the hospital encounter of 08/02/19 (from the past 48 hour(s))  Comprehensive metabolic panel     Status: Abnormal   Collection Time: 08/02/19  8:49 PM  Result Value Ref Range   Sodium 137 135 - 145 mmol/L   Potassium 3.7 3.5 - 5.1 mmol/L   Chloride 101 98 - 111 mmol/L   CO2 24 22 - 32 mmol/L   Glucose, Bld 77 70 - 99 mg/dL   BUN 18 6 - 20 mg/dL   Creatinine, Ser 1.04 0.61 - 1.24 mg/dL   Calcium 9.7 8.9 - 10.3 mg/dL   Total Protein 8.3 (H) 6.5 - 8.1 g/dL   Albumin 5.0 3.5 - 5.0 g/dL   AST 20 15 - 41 U/L   ALT 20 0 - 44 U/L   Alkaline Phosphatase 61 38 - 126 U/L   Total Bilirubin 0.9 0.3 - 1.2 mg/dL   GFR calc non Af Amer >60 >60 mL/min   GFR calc Af Amer >60 >60 mL/min   Anion gap 12 5 - 15    Comment: Performed at [facility], [place] [address], [place], [place] [address]  Ethanol     Status: None   Collection Time: 08/02/19  8:49 PM  Result  Value Ref Range   Alcohol, Ethyl (B) <10 <10 mg/dL    Comment: (NOTE) Lowest detectable limit for serum alcohol is 10 mg/dL. For medical purposes only. Performed at [facility], [place] [address], [place], [place] [address]   Salicylate level     Status: Abnormal   Collection Time: 08/02/19  8:49 PM  Result Value Ref Range   Salicylate Lvl <3.2 (L) 7.0 - 30.0 mg/dL    Comment: Performed at [facility], [place] [address], [place], [place] [address]  Acetaminophen level     Status: Abnormal   Collection Time: 08/02/19  8:49 PM  Result Value Ref Range   Acetaminophen (Tylenol), Serum <10 (L) 10 - 30 ug/mL    Comment: (NOTE) Therapeutic concentrations vary significantly. A range of 10-30 ug/mL  may be an effective concentration for many patients. However, some  are best treated at concentrations  outside of this range. Acetaminophen concentrations >150 ug/mL at 4 hours after ingestion  and >50 ug/mL at 12 hours after ingestion are often associated with  toxic reactions. Performed at [facility], [address] [place], [place] [address]   cbc     Status: Abnormal   Collection Time: 08/02/19  8:49 PM  Result Value Ref Range   WBC 11.5 (H) 4.0 - 10.5 K/uL   RBC 5.50 4.22 - 5.81 MIL/uL   Hemoglobin 17.0 13.0 - 17.0 g/dL   HCT 78.250.6 95.639.0 - 21.352.0 %   MCV 92.0 80.0 - 100.0 fL   MCH 30.9 26.0 - 34.0 pg   MCHC 33.6 30.0 - 36.0 g/dL   RDW 08.612.4 57.811.5 - 46.915.5 %   Platelets 281 150 - 400 K/uL   nRBC 0.0 0.0 - 0.2 %    Comment: Performed at [facility], [address] [place], [place] [address]  Rapid urine drug screen (hospital performed)     Status: Abnormal   Collection Time: 08/02/19  8:49 PM  Result Value Ref Range   Opiates NONE DETECTED NONE DETECTED   Cocaine NONE DETECTED NONE DETECTED   Benzodiazepines NONE DETECTED NONE DETECTED   Amphetamines NONE DETECTED NONE DETECTED   Tetrahydrocannabinol POSITIVE  (A) NONE DETECTED   Barbiturates NONE DETECTED NONE DETECTED    Comment: (NOTE) DRUG SCREEN FOR MEDICAL PURPOSES ONLY.  IF CONFIRMATION IS NEEDED FOR ANY PURPOSE, NOTIFY LAB WITHIN 5 DAYS. LOWEST DETECTABLE LIMITS FOR URINE DRUG SCREEN Drug Class                     Cutoff (ng/mL) Amphetamine and metabolites    1000 Barbiturate and metabolites    200 Benzodiazepine                 200 Tricyclics and metabolites     300 Opiates and metabolites        300 Cocaine and metabolites        300 THC                            50 Performed at [facility], [address] [place], [place] [address]   Respiratory Panel by RT PCR (Flu A&B, Covid) - Nasopharyngeal Swab     Status: None   Collection Time: 08/03/19 12:15 AM   Specimen: Nasopharyngeal Swab  Result Value Ref Range   SARS Coronavirus 2 by RT PCR NEGATIVE NEGATIVE    Comment: (NOTE) SARS-CoV-2 target nucleic acids are NOT DETECTED. The SARS-CoV-2 RNA is generally detectable in upper respiratoy specimens during the acute phase of infection. The lowest concentration of SARS-CoV-2 viral copies this assay can detect is 131 copies/mL. A negative result does not preclude SARS-Cov-2 infection and should not be used as the sole basis for treatment or other patient management decisions. A negative result may occur with  improper specimen collection/handling, submission of specimen other than nasopharyngeal swab, presence of viral mutation(s) within the areas targeted by this assay, and inadequate number of viral copies (<131 copies/mL). A negative result must be combined with clinical observations, patient history, and epidemiological information. The expected result is Negative. Fact Sheet for Patients:  https://www.moore.com/https://www.fda.gov/media/142436/download Fact Sheet for Healthcare Providers:  https://www.young.biz/https://www.fda.gov/media/142435/download This test is not yet ap proved or cleared by the [place] FDA and  has been  authorized for detection and/or diagnosis of SARS-CoV-2 by FDA under an  Emergency Use Authorization (EUA). This EUA will remain  in effect (meaning this test can be used) for the duration of the COVID-19 declaration under Section 564(b)(1) of the Act, 21 U.S.C. section 360bbb-3(b)(1), unless the authorization is terminated or revoked sooner.    Influenza A by PCR NEGATIVE NEGATIVE   Influenza B by PCR NEGATIVE NEGATIVE    Comment: (NOTE) The Xpert Xpress SARS-CoV-2/FLU/RSV assay is intended as an aid in  the diagnosis of influenza from Nasopharyngeal swab specimens and  should not be used as a sole basis for treatment. Nasal washings and  aspirates are unacceptable for Xpert Xpress SARS-CoV-2/FLU/RSV  testing. Fact Sheet for Patients: https://www.moore.com/ Fact Sheet for Healthcare Providers: https://www.young.biz/ This test is not yet approved or cleared by the [place] FDA and  has been authorized for detection and/or diagnosis of SARS-CoV-2 by  FDA under an Emergency Use Authorization (EUA). This EUA will remain  in effect (meaning this test can be used) for the duration of the  Covid-19 declaration under Section 564(b)(1) of the Act, 21  U.S.C. section 360bbb-3(b)(1), unless the authorization is  terminated or revoked. Performed at [facility], [address], [place], [place] [address]     Blood Alcohol level:  Lab Results  Component Value Date   ETH <10 08/02/2019    Metabolic Disorder Labs:  No results found for: HGBA1C, MPG No results found for: PROLACTIN No results found for: CHOL, TRIG, HDL, CHOLHDL, VLDL, LDLCALC  Current Medications: Current Facility-Administered Medications  Medication Dose Route Frequency Provider Last Rate Last Admin  . acetaminophen (TYLENOL) tablet 650 mg  650 mg Oral Q6H PRN Nira Conn A, NP      . alum & mag hydroxide-simeth (MAALOX/MYLANTA) 200-200-20 MG/5ML suspension  30 mL  30 mL Oral Q4H PRN Nira Conn A, NP      . hydrOXYzine (ATARAX/VISTARIL) tablet 25 mg  25 mg Oral TID PRN Nira Conn A, NP   25 mg at 08/03/19 0356  . magnesium hydroxide (MILK OF MAGNESIA) suspension 30 mL  30 mL Oral Daily PRN Nira Conn A, NP      . QUEtiapine (SEROQUEL) tablet 100 mg  100 mg Oral QHS Antonieta Pert, MD      . QUEtiapine (SEROQUEL) tablet 25 mg  25 mg Oral NOW Antonieta Pert, MD      . sertraline (ZOLOFT) tablet 25 mg  25 mg Oral Daily Antonieta Pert, MD      . traZODone (DESYREL) tablet 50 mg  50 mg Oral QHS PRN Antonieta Pert, MD       PTA Medications: No medications prior to admission.    Musculoskeletal: Strength & Muscle Tone: within normal limits Gait & Station: normal Patient leans: N/A  Psychiatric Specialty Exam: Physical Exam  Nursing note and vitals reviewed. Constitutional: He is oriented to person, place, and time. He appears well-developed and well-nourished.  HENT:  Head: Normocephalic and atraumatic.  Respiratory: Effort normal.  Neurological: He is alert and oriented to person, place, and time.    Review of Systems  Blood pressure 135/68, pulse 84, temperature (!) 97.5 F (36.4 C), temperature source Oral, resp. rate 18, height 6' (1.829 m), weight 72.1 kg, SpO2 100 %.Body mass index is 21.56 kg/m.  General Appearance: Disheveled  Eye Contact:  Minimal  Speech:  Normal Rate  Volume:  Increased  Mood:  Anxious and Depressed  Affect:  Congruent and Tearful  Thought Process:  Coherent and Descriptions of Associations:  Circumstantial  Orientation:  Full (Time, Place, and Person)  Thought Content:  Hallucinations: Auditory  Suicidal Thoughts:  No  Homicidal Thoughts:  Yes.  without intent/plan  Memory:  Immediate;   Fair Recent;   Fair Remote;   Fair  Judgement:  Impaired  Insight:  Lacking  Psychomotor Activity:  Increased  Concentration:  Concentration: Fair and Attention Span: Fair  Recall:  [employer] of  Knowledge:  Fair  Language:  Good  Akathisia:  Negative  Handed:  Right  AIMS (if indicated):     Assets:  Desire for Improvement Resilience  ADL's:  Intact  Cognition:  WNL  Sleep:       Treatment Plan Summary: Daily contact with patient to assess and evaluate symptoms and progress in treatment, Medication management and Plan : Patient is seen and examined. Patient is a 19 year old male with the above-stated past psychiatric history who presented to the [facility] emergency department on 08/02/2019 secondary to suicidal ideation. He will be admitted to the hospital. He will be integrated into the milieu. He will be encouraged to attend groups. He will be started on Zoloft 25 mg p.o. daily. He will also be given Seroquel 50 mg p.o. nightly. I am going to give him 25 mg right now secondary to his agitation. He will also have available hydroxyzine for anxiety as well as trazodone for sleep. Review of his laboratories showed essentially normal electrolytes, essentially normal CBC, negative acetaminophen and salicylate. Blood alcohol was less than 10. Drug screen was positive for marijuana.  Observation Level/Precautions:  15 minute checks  Laboratory:  Chemistry Profile  Psychotherapy:    Medications:    Consultations:    Discharge Concerns:    Estimated LOS:  Other:     Physician Treatment Plan for Primary Diagnosis: Adjustment disorder with mixed anxiety and depressed mood Long Term Goal(s): Improvement in symptoms so as ready for discharge  Short Term Goals: Ability to identify changes in lifestyle to reduce recurrence of condition will improve, Ability to verbalize feelings will improve, Ability to disclose and discuss suicidal ideas, Ability to demonstrate self-control will improve, Ability to identify and develop effective coping behaviors will improve, Ability to maintain clinical measurements within normal limits will improve and Ability to identify triggers  associated with substance abuse/mental health issues will improve  Physician Treatment Plan for Secondary Diagnosis: Principal Problem:   Adjustment disorder with mixed anxiety and depressed mood Active Problems:   Homicidal ideations  Long Term Goal(s): Improvement in symptoms so as ready for discharge  Short Term Goals: Ability to identify changes in lifestyle to reduce recurrence of condition will improve, Ability to verbalize feelings will improve, Ability to disclose and discuss suicidal ideas, Ability to demonstrate self-control will improve, Ability to identify and develop effective coping behaviors will improve, Ability to maintain clinical measurements within normal limits will improve and Ability to identify triggers associated with substance abuse/mental health issues will improve  I certify that inpatient services furnished can reasonably be expected to improve the patient's condition.    Antonieta Pert, MD 1/18/202111:20 AM
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
## 2019-08-03 NOTE — Tx Team (Signed)
Initial Treatment Plan 08/03/2019 1:24 PM Christopher [name] VZS:[id number]    PATIENT STRESSORS: Loss of grandfather Traumatic event   PATIENT STRENGTHS: Ability for insight Active sense of humor Average or above average intelligence Communication skills Motivation for treatment/growth Physical Health Supportive family/friends   PATIENT IDENTIFIED PROBLEMS: depression  anxiety  SI  grieving               DISCHARGE CRITERIA:  Ability to meet basic life and health needs Improved stabilization in mood, thinking, and/or behavior Motivation to continue treatment in a less acute level of care Need for constant or close observation no longer present  PRELIMINARY DISCHARGE PLAN: Attend aftercare/continuing care group Outpatient therapy Return to previous living arrangement  PATIENT/FAMILY INVOLVEMENT: This treatment plan has been presented to and reviewed with the patient, [employer].  The patient and family have been given the opportunity to ask questions and make suggestions.  Christopher Miyamoto, RN 08/03/2019, 1:24 PM

## 2019-08-03 NOTE — [facility] Suicide Risk Assessment (Signed)
[facility] Admission Suicide Risk Assessment   Nursing information obtained from:  Patient Demographic factors:  Male Current Mental Status:  NA Loss Factors:  Financial problems / change in socioeconomic status Historical Factors:  Anniversary of important loss Risk Reduction Factors:  Positive therapeutic relationship  Total Time spent with patient: 30 minutes Principal Problem: Adjustment disorder with mixed anxiety and depressed mood Diagnosis:  Principal Problem:   Adjustment disorder with mixed anxiety and depressed mood Active Problems:   Homicidal ideations  Subjective Data: Patient is seen and examined. Patient is a 19 year old male with a negative past psychiatric history up to this point who presented to the [facility] emergency department on 08/02/2019 secondary to homicidal ideation. The patient was significantly tearful throughout the interview. The patient stated that he had been depressed since his grandfather died last summer. The patient stated that he is also upset because "everybody lies to me". He has tried to talk to his girlfriend, but she is brushed them off. He admitted to crying spells, helplessness, hopelessness, worthlessness and insomnia. He also felt as though people were trying to harm him. He stated he is felt that way ever since he joined a gang. Collateral information from his father suggested that he had been distant and isolating himself recently. He also has a court date sometime at the end of this month for larceny and trespassing. He denied any previous attempts to harm himself. He denied any previous psychiatric medications, he denied any previous psychiatric admissions. He stated his family brought him to the emergency room because "all of them had been admitted before for similar reasons". He was admitted to the hospital for evaluation and stabilization.  Continued Clinical Symptoms:  Alcohol Use Disorder Identification Test Final Score  (AUDIT): 0 The "Alcohol Use Disorders Identification Test", Guidelines for Use in Primary Care, Second Edition.  World Pharmacologist [facility]). Score between 0-7:  no or low risk or alcohol related problems. Score between 8-15:  moderate risk of alcohol related problems. Score between 16-19:  high risk of alcohol related problems. Score 20 or above:  warrants further diagnostic evaluation for alcohol dependence and treatment.   CLINICAL FACTORS:   Severe Anxiety and/or Agitation Depression:   Anhedonia Hopelessness Impulsivity Insomnia Alcohol/Substance Abuse/Dependencies   Musculoskeletal: Strength & Muscle Tone: within normal limits Gait & Station: normal Patient leans: N/A  Psychiatric Specialty Exam: Physical Exam  Nursing note and vitals reviewed. Constitutional: He is oriented to person, place, and time. He appears well-developed and well-nourished.  HENT:  Head: Normocephalic and atraumatic.  Respiratory: Effort normal.  Neurological: He is alert and oriented to person, place, and time.    Review of Systems  Blood pressure 135/68, pulse 84, temperature (!) 97.5 F (36.4 C), temperature source Oral, resp. rate 18, height 6' (1.829 m), weight 72.1 kg, SpO2 100 %.Body mass index is 21.56 kg/m.  General Appearance: Disheveled  Eye Contact:  Minimal  Speech:  Normal Rate  Volume:  Normal  Mood:  Anxious and Depressed  Affect:  Congruent and Tearful  Thought Process:  Coherent and Descriptions of Associations: Circumstantial  Orientation:  Full (Time, Place, and Person)  Thought Content:  Delusions  Suicidal Thoughts:  No  Homicidal Thoughts:  Yes.  without intent/plan  Memory:  Immediate;   Fair Recent;   Fair Remote;   Fair  Judgement:  Impaired  Insight:  Lacking  Psychomotor Activity:  Increased  Concentration:  Concentration: Fair and Attention Span: Fair  Recall:  Fair  Fund of Knowledge:  Fair  Language:  Fair  Akathisia:  Negative  Handed:  Right   AIMS (if indicated):     Assets:  Desire for Improvement Resilience  ADL's:  Intact  Cognition:  WNL  Sleep:         COGNITIVE FEATURES THAT CONTRIBUTE TO RISK:  None    SUICIDE RISK:   Mild:  Suicidal ideation of limited frequency, intensity, duration, and specificity.  There are no identifiable plans, no associated intent, mild dysphoria and related symptoms, good self-control (both objective and subjective assessment), few other risk factors, and identifiable protective factors, including available and accessible social support.  PLAN OF CARE: Patient is seen and examined. Patient is a 19 year old male with the above-stated past psychiatric history who presented to the [facility] emergency department on 08/02/2019 secondary to suicidal ideation. He will be admitted to the hospital. He will be integrated into the milieu. He will be encouraged to attend groups. He will be started on Zoloft 25 mg p.o. daily. He will also be given Seroquel 50 mg p.o. nightly. I am going to give him 25 mg right now secondary to his agitation. He will also have available hydroxyzine for anxiety as well as trazodone for sleep. Review of his laboratories showed essentially normal electrolytes, essentially normal CBC, negative acetaminophen and salicylate. Blood alcohol was less than 10. Drug screen was positive for marijuana.  I certify that inpatient services furnished can reasonably be expected to improve the patient's condition.   Antonieta Pert, MD 08/03/2019, 11:13 AM

## 2019-08-03 NOTE — ED Notes (Signed)
Patient picked up and taken to bhh with all belongings and voluntary paper

## 2019-08-03 NOTE — Plan of Care (Signed)
[facility] Crises Note  Reason for Crisis Plan: Homicidal Ideations and worsening depression  Plan of Care: Stabilization with medications and other supportive resources as needed  Family Support:   Parents, Girlfriend  Current Living Environment:  Living Arrangements: Other relatives, Parent  Insurance:   Hospital Account    Name Acct ID Class Status Primary Coverage   [name], Benen [id number] BEHAVIORAL HEALTH OBSERVATION Open [place]        Guarantor Account (for Hospital Account [id number])    Name Relation to Pt Service Area Active? Acct Type   Bourke, Herma [facility] Yes Behavioral Health   Address Phone       [address] [place], [place] [address] [phone](H)          Coverage Information (for Hospital Account [id number])    F/O Payor/Plan Precert #   [facility] [place]    Subscriber Subscriber #   [name], Christopher [id number]   Address Phone   [address] [place], [place] [address] [phone]      Legal Guardian:   None  Primary Care Provider:  Patient, No Pcp Per  Current Outpatient Providers:  None as per patient  Psychiatrist:    None outpatient Counselor/Therapist:   None as per patient  Compliant with Medications:  Not taking any meds as home  Additional Information:Pt is a 19 year old African American male admitted voluntarily from [facility], and reports +HI towards any body who has "done me wrong".  Pt also reports worsening depression due to the death of his grand father last summer, states he felt better by talking to his girlfriend, but has not been able to do so due to his girlfriend having to work.  Pt denies AVH/HI, but states that he sometimes becomes paranoid, and thinks people are out to get him.  Pt reports that he also recently joined "[employer]" gang, and states that he smokes as much marijuana as he can get in a day.  Pt reports that he lives with his parents who are supportive.  Pt  calm and cooperative with admission process, educated on unit and his room, Q15 minute safety checks initiated.  Will continue to monitor.  Christopher [name] 1/18/20214:12 AM

## 2019-08-03 NOTE — Progress Notes (Signed)
Psychoeducational Group Note  Date:  08/03/2019 Time:  2107  Group Topic/Focus:  Wrap-Up Group:   The focus of this group is to help patients review their daily goal of treatment and discuss progress on daily workbooks.  Participation Level: Did Not Attend  Participation Quality:  Not Applicable  Affect:  Not Applicable  Cognitive:  Not Applicable  Insight:  Not Applicable  Engagement in Group: Not Applicable  Additional Comments:  The patient did not attend group this evening.   Hazle Coca S 08/03/2019, 9:07 PM

## 2019-08-03 NOTE — Discharge Summary (Signed)
  Patient to be transferred to [facility] inpatient for psychiatric treatment 

## 2019-08-04 MED ORDER — ENSURE ENLIVE PO LIQD
237.0000 mL | Freq: Two times a day (BID) | ORAL | Status: DC
  Administered 2019-08-04 – 2019-08-06 (×4): 237 mL via ORAL

## 2019-08-04 MED ORDER — SERTRALINE HCL 50 MG PO TABS
50.0000 mg | ORAL_TABLET | Freq: Every day | ORAL | Status: DC
  Administered 2019-08-05 – 2019-08-06 (×2): 50 mg via ORAL
  Filled 2019-08-04 (×4): qty 1

## 2019-08-04 NOTE — Progress Notes (Signed)
The patient learned today to be "more positive". He admits to not having much of an appetite which is abnormal for him. His goal for tomorrow is to simply make it thru the day.

## 2019-08-04 NOTE — Progress Notes (Signed)
   08/04/19 0116  Psych Admission Type (Psych Patients Only)  Admission Status Voluntary  Psychosocial Assessment  Patient Complaints Depression;Anhedonia  Eye Contact Fair  Facial Expression Sad  Speech Logical/coherent  Interaction Assertive  Motor Activity Other (Comment) (WNL)  Appearance/Hygiene Unremarkable;In scrubs  Behavior Characteristics Cooperative  Mood Pleasant  Thought Process  Coherency WDL  Content WDL  Delusions None reported or observed  Perception WDL  Hallucination None reported or observed  Judgment Poor  Confusion None  Danger to Self  Current suicidal ideation? Denies  Agreement Not to Harm Self Yes  Description of Agreement verbal  Danger to Others  Danger to Others None reported or observed   Pt at nurse's station. Pt has depressed mood. States that grandfather died in summer 2020 and they had a close relationship as he was the only grandson. Pt still grieving. Says he will "get tearful from time to time for no reason." Pt stated that his depression is 5/10. Pt denies SI, HI, AVH and pain. Pt contracts for safety.

## 2019-08-04 NOTE — Progress Notes (Signed)
    08/04/19 0900  Psych Admission Type (Psych Patients Only)  Admission Status Voluntary  Psychosocial Assessment  Patient Complaints Depression  Eye Contact Fair  Facial Expression Sad  Affect Angry;Depressed  Speech Logical/coherent  Interaction Assertive  Motor Activity Other (Comment) (WNL)  Appearance/Hygiene Unremarkable;In scrubs  Behavior Characteristics Cooperative  Mood Pleasant  Thought Process  Coherency WDL  Content WDL  Delusions None reported or observed  Perception WDL  Hallucination None reported or observed  Judgment Poor  Confusion None  Danger to Self  Current suicidal ideation? Denies  Agreement Not to Harm Self Yes  Description of Agreement verbal  Danger to Others  Danger to Others None reported or observed

## 2019-08-04 NOTE — Progress Notes (Signed)
Recreation Therapy Notes  Animal-Assisted Activity (AAA) Program Checklist/Progress Notes Patient Eligibility Criteria Checklist & Daily Group note for Rec Tx Intervention  Date: 1.19.21 Time: 1430 Location: [address]   AAA/T Program Assumption of Risk Form signed by Engineer, production or Parent Legal Guardian YES   Patient is free of allergies or sever asthma  YES   Patient reports no fear of animals  YES   Patient reports no history of cruelty to animals YES   Patient understands his/her participation is voluntary YES   Patient washes hands before animal contact  YES   Patient washes hands after animal contact YES  Education: [name], Appropriate Animal Interaction   Education Outcome: Acknowledges understanding/In group clarification offered/Needs additional education.   Clinical Observations/Feedback:  Patient did not attend group activity.    Caroll Rancher, LRT/CTRS         Caroll Rancher A 08/04/2019 3:37 PM

## 2019-08-04 NOTE — Progress Notes (Signed)
[facility] MD Progress Note  08/04/2019 2:14 PM Tavares Levinson  MRN:  [id number] Subjective: Patient reports feeling vaguely drowsy/sleepy this morning.  Describes still feeling depressed.  Denies suicidal ideations and contracts for safety on unit.  Denies medication side effects thus far. Objective: Have discussed case with treatment team and met with patient. 19 year old male, no past psychiatric history, presented to ED reporting vague homicidal ideations but also endorsing depression, neurovegetative symptoms.  Reports has been depressed since his grandfather passed away earlier last summer.  He also reported vague paranoid ideations that people were trying to hurt him or kill him. Reports daily cannabis use.  Today patient presents in bed, awake, reporting feeling drowsy but able to participate fully in the session.  States he is feeling "about the same".  Of note today denies any homicidal ideations.  Also denies suicidal ideations.  Denies hallucinations and does not appear internally preoccupied. He does continue to express grief related to the death of his grandfather whom he was very close. He is currently on Zoloft, Seroquel.  Denies medication side effects thus far. Limited milieu participation, no disruptive or agitated behaviors on unit Principal Problem: MDD Diagnosis: Principal Problem:   Adjustment disorder with mixed anxiety and depressed mood Active Problems:   Homicidal ideations   Acute adjustment disorder with mixed anxiety and depressed mood   MDD (major depressive disorder), single episode, severe with psychosis ([place])  Total Time spent with patient: 20 minutes  Past Psychiatric History:   Past Medical History: History reviewed. No pertinent past medical history. History reviewed. No pertinent surgical history. Family History: History reviewed. No pertinent family history. Family Psychiatric  History:  Social History:  Social History   Substance and Sexual Activity   Alcohol Use No     Social History   Substance and Sexual Activity  Drug Use Yes  . Types: Marijuana    Social History   Socioeconomic History  . Marital status: Single    Spouse name: Not on file  . Number of children: Not on file  . Years of education: Not on file  . Highest education level: Not on file  Occupational History  . Not on file  Tobacco Use  . Smoking status: Current Every Day Smoker    Types: Cigars  . Smokeless tobacco: Never Used  Substance and Sexual Activity  . Alcohol use: No  . Drug use: Yes    Types: Marijuana  . Sexual activity: Not on file  Other Topics Concern  . Not on file  Social History Narrative  . Not on file   Social Determinants of Health   Financial Resource Strain:   . Difficulty of Paying Living Expenses: Not on file  Food Insecurity:   . Worried About [name] in the Last Year: Not on file  . Ran Out of Food in the Last Year: Not on file  Transportation Needs:   . Lack of Transportation (Medical): Not on file  . Lack of Transportation (Non-Medical): Not on file  Physical Activity:   . Days of Exercise per Week: Not on file  . Minutes of Exercise per Session: Not on file  Stress:   . Feeling of Stress : Not on file  Social Connections:   . Frequency of Communication with Friends and Family: Not on file  . Frequency of Social Gatherings with Friends and Family: Not on file  . Attends Religious Services: Not on file  . Active Member of Clubs or Organizations: Not  on file  . Attends Archivist Meetings: Not on file  . Marital Status: Not on file   Additional Social History:   Sleep: Good  Appetite:  Good  Current Medications: Current Facility-Administered Medications  Medication Dose Route Frequency Provider Last Rate Last Admin  . acetaminophen (TYLENOL) tablet 650 mg  650 mg Oral Q6H PRN Rankin, Shuvon B, NP      . alum & mag hydroxide-simeth (MAALOX/MYLANTA) 200-200-20 MG/5ML suspension 30 mL   30 mL Oral Q4H PRN Rankin, Shuvon B, NP      . feeding supplement (ENSURE ENLIVE) (ENSURE ENLIVE) liquid 237 mL  237 mL Oral BID BM Sharma Covert, MD   237 mL at 08/04/19 1127  . hydrOXYzine (ATARAX/VISTARIL) tablet 25 mg  25 mg Oral TID PRN Rankin, Shuvon B, NP   25 mg at 08/03/19 1845  . magnesium hydroxide (MILK OF MAGNESIA) suspension 30 mL  30 mL Oral Daily PRN Rankin, Shuvon B, NP      . nicotine (NICODERM CQ - dosed in mg/24 hours) patch 14 mg  14 mg Transdermal Daily Lindon Romp A, NP   14 mg at 08/04/19 0835  . QUEtiapine (SEROQUEL) tablet 100 mg  100 mg Oral QHS Rankin, Shuvon B, NP   100 mg at 08/03/19 2129  . sertraline (ZOLOFT) tablet 25 mg  25 mg Oral Daily Rankin, Shuvon B, NP   25 mg at 08/04/19 0834  . traZODone (DESYREL) tablet 50 mg  50 mg Oral QHS PRN Rankin, Shuvon B, NP        Lab Results:  Results for orders placed or performed during the hospital encounter of 08/02/19 (from the past 48 hour(s))  Comprehensive metabolic panel     Status: Abnormal   Collection Time: 08/02/19  8:49 PM  Result Value Ref Range   Sodium 137 135 - 145 mmol/L   Potassium 3.7 3.5 - 5.1 mmol/L   Chloride 101 98 - 111 mmol/L   CO2 24 22 - 32 mmol/L   Glucose, Bld 77 70 - 99 mg/dL   BUN 18 6 - 20 mg/dL   Creatinine, Ser 1.04 0.61 - 1.24 mg/dL   Calcium 9.7 8.9 - 10.3 mg/dL   Total Protein 8.3 (H) 6.5 - 8.1 g/dL   Albumin 5.0 3.5 - 5.0 g/dL   AST 20 15 - 41 U/L   ALT 20 0 - 44 U/L   Alkaline Phosphatase 61 38 - 126 U/L   Total Bilirubin 0.9 0.3 - 1.2 mg/dL   GFR calc non Af Amer >60 >60 mL/min   GFR calc Af Amer >60 >60 mL/min   Anion gap 12 5 - 15    Comment: Performed at [facility], [place] [address], [place], [place] [address]  Ethanol     Status: None   Collection Time: 08/02/19  8:49 PM  Result Value Ref Range   Alcohol, Ethyl (B) <10 <10 mg/dL    Comment: (NOTE) Lowest detectable limit for serum alcohol is 10 mg/dL. For medical purposes only. Performed  at [facility], [place] [address] [place], [place] [address]   Salicylate level     Status: Abnormal   Collection Time: 08/02/19  8:49 PM  Result Value Ref Range   Salicylate Lvl <9.2 (L) 7.0 - 30.0 mg/dL    Comment: Performed at [facility], [place] [address], [place], [place] [address]  Acetaminophen level     Status: Abnormal   Collection Time:  08/02/19  8:49 PM  Result Value Ref Range   Acetaminophen (Tylenol), Serum <10 (L) 10 - 30 ug/mL    Comment: (NOTE) Therapeutic concentrations vary significantly. A range of 10-30 ug/mL  may be an effective concentration for many patients. However, some  are best treated at concentrations outside of this range. Acetaminophen concentrations >150 ug/mL at 4 hours after ingestion  and >50 ug/mL at 12 hours after ingestion are often associated with  toxic reactions. Performed at [facility], [place] [address], [place], [place] [address]   cbc     Status: Abnormal   Collection Time: 08/02/19  8:49 PM  Result Value Ref Range   WBC 11.5 (H) 4.0 - 10.5 K/uL   RBC 5.50 4.22 - 5.81 MIL/uL   Hemoglobin 17.0 13.0 - 17.0 g/dL   HCT 50.6 39.0 - 52.0 %   MCV 92.0 80.0 - 100.0 fL   MCH 30.9 26.0 - 34.0 pg   MCHC 33.6 30.0 - 36.0 g/dL   RDW 12.4 11.5 - 15.5 %   Platelets 281 150 - 400 K/uL   nRBC 0.0 0.0 - 0.2 %    Comment: Performed at [facility], [place] [address], [place], [place] [address]  Rapid urine drug screen (hospital performed)     Status: Abnormal   Collection Time: 08/02/19  8:49 PM  Result Value Ref Range   Opiates NONE DETECTED NONE DETECTED   Cocaine NONE DETECTED NONE DETECTED   Benzodiazepines NONE DETECTED NONE DETECTED   Amphetamines NONE DETECTED NONE DETECTED   Tetrahydrocannabinol POSITIVE (A) NONE DETECTED   Barbiturates NONE DETECTED NONE DETECTED    Comment: (NOTE) DRUG SCREEN FOR MEDICAL PURPOSES ONLY.  IF CONFIRMATION IS NEEDED FOR ANY PURPOSE,  NOTIFY LAB WITHIN 5 DAYS. LOWEST DETECTABLE LIMITS FOR URINE DRUG SCREEN Drug Class                     Cutoff (ng/mL) Amphetamine and metabolites    1000 Barbiturate and metabolites    200 Benzodiazepine                 482 Tricyclics and metabolites     300 Opiates and metabolites        300 Cocaine and metabolites        300 THC                            50 Performed at [facility], [place] [address] [place], [place] [address]   Respiratory Panel by RT PCR (Flu A&B, Covid) - Nasopharyngeal Swab     Status: None   Collection Time: 08/03/19 12:15 AM   Specimen: Nasopharyngeal Swab  Result Value Ref Range   SARS Coronavirus 2 by RT PCR NEGATIVE NEGATIVE    Comment: (NOTE) SARS-CoV-2 target nucleic acids are NOT DETECTED. The SARS-CoV-2 RNA is generally detectable in upper respiratoy specimens during the acute phase of infection. The lowest concentration of SARS-CoV-2 viral copies this assay can detect is 131 copies/mL. A negative result does not preclude SARS-Cov-2 infection and should not be used as the sole basis for treatment or other patient management decisions. A negative result may occur with  improper specimen collection/handling, submission of specimen other than nasopharyngeal swab, presence of viral mutation(s) within the areas targeted by this assay, and inadequate number of viral copies (<131 copies/mL). A negative result must be combined with clinical observations, patient history,  and epidemiological information. The expected result is Negative. Fact Sheet for Patients:  PinkCheek.be Fact Sheet for Healthcare Providers:  GravelBags.it This test is not yet ap proved or cleared by the [place] FDA and  has been authorized for detection and/or diagnosis of SARS-CoV-2 by FDA under an Emergency Use Authorization (EUA). This EUA will remain  in effect (meaning this test can be used) for  the duration of the COVID-19 declaration under Section 564(b)(1) of the Act, 21 U.S.C. section 360bbb-3(b)(1), unless the authorization is terminated or revoked sooner.    Influenza A by PCR NEGATIVE NEGATIVE   Influenza B by PCR NEGATIVE NEGATIVE    Comment: (NOTE) The Xpert Xpress SARS-CoV-2/FLU/RSV assay is intended as an aid in  the diagnosis of influenza from Nasopharyngeal swab specimens and  should not be used as a sole basis for treatment. Nasal washings and  aspirates are unacceptable for Xpert Xpress SARS-CoV-2/FLU/RSV  testing. Fact Sheet for Patients: PinkCheek.be Fact Sheet for Healthcare Providers: GravelBags.it This test is not yet approved or cleared by the [place] FDA and  has been authorized for detection and/or diagnosis of SARS-CoV-2 by  FDA under an Emergency Use Authorization (EUA). This EUA will remain  in effect (meaning this test can be used) for the duration of the  Covid-19 declaration under Section 564(b)(1) of the Act, 21  U.S.C. section 360bbb-3(b)(1), unless the authorization is  terminated or revoked. Performed at [facility], [place] [address] [place], [place] [address]     Blood Alcohol level:  Lab Results  Component Value Date   ETH <10 60/45/4098    Metabolic Disorder Labs: No results found for: HGBA1C, MPG No results found for: PROLACTIN No results found for: CHOL, TRIG, HDL, CHOLHDL, VLDL, LDLCALC  Physical Findings: AIMS:  , ,  ,  ,    CIWA:    COWS:     Musculoskeletal: Strength & Muscle Tone: within normal limits Gait & Station: normal Patient leans: N/A  Psychiatric Specialty Exam: Physical Exam  Review of Systems denies chest pain, no shortness of breath, no vomiting  Blood pressure 135/68, pulse 84, temperature (!) 97.5 F (36.4 C), temperature source Oral, resp. rate 18, height 6' (1.829 m), weight 72.1 kg, SpO2 100 %.Body mass index is  21.56 kg/m.  General Appearance: Fairly Groomed  Eye Contact:  Fair  Speech:  Normal Rate  Volume:  Normal  Mood:  Depressed and vaguely dysphoric  Affect:  Congruent  Thought Process:  Linear and Descriptions of Associations: Intact  Orientation:  Other:  Alert and attentive  Thought Content:  No hallucinations, no delusions expressed, does not appear internally preoccupied  Suicidal Thoughts:  No currently denies suicidal or self-injurious ideations  Homicidal Thoughts:  No  Memory:  Recent and remote grossly intact  Judgement:  Good  Insight:  Fair  Psychomotor Activity:  Decreased  Concentration:  Concentration: Good and Attention Span: Fair  Recall:  Good  Fund of Knowledge:  Good  Language:  Good  Akathisia:  Negative  Handed:  Right  AIMS (if indicated):     Assets:  Desire for Improvement Resilience  ADL's:  Intact  Cognition:  WNL  Sleep:  Number of Hours: 6   Assessment:  19 year old male, no past psychiatric history, presented to ED reporting vague homicidal ideations but also endorsing depression, neurovegetative symptoms.  Reports has been depressed since his grandfather passed away earlier last summer.. Reports daily cannabis use.  Today patient presents vaguely depressed, with  constricted/cautious affect.  Endorses feeling depressed related to the death of his grandfather last year.  Currently denies hallucinations and does not appear internally preoccupied.  At this time denies suicidal ideations.  He also denies homicidal ideations (which he had expressed on admission) He is currently on Seroquel, Zoloft, denies side effects thus far, but describes feeling vaguely sedated this morning. Treatment Plan Summary: Daily contact with patient to assess and evaluate symptoms and progress in treatment, Medication management, Plan Inpatient treatment and Medication management as below Encourage group and milieu participation Increase Zoloft to 50 mg daily for depression  and anxiety Continue Seroquel 100 mg nightly for mood disorder/psychosis Discontinue Trazodone to minimize sedation. Continue Vistaril 25 mg every 8 hours as needed for anxiety Check Lipid Panel , HgbA1C , Prolactin. Treatment team working on disposition Ruma, MD 08/04/2019, 2:14 PM

## 2019-08-04 NOTE — Progress Notes (Signed)
NUTRITION ASSESSMENT  Pt identified as at risk on the Malnutrition Screen Tool  INTERVENTION: 1. Supplements: Ensure Enlive po BID, each supplement provides 350 kcal and 20 grams of protein  NUTRITION DIAGNOSIS: Unintentional weight loss related to sub-optimal intake as evidenced by pt report.   Goal: Pt to meet >/= 90% of their estimated nutrition needs.  Monitor:  PO intake  Assessment:  Pt admitted for adjustment disorder with mixed anxiety and depressed mood. Pt reports poor appetite. Per weight records, pt has lost 20 lbs since March 2020, but this is insignificant for time frame. Will order Ensure supplements given weight loss.   Height: Ht Readings from Last 1 Encounters:  08/03/19 6' (1.829 m)    Weight: Wt Readings from Last 1 Encounters:  08/03/19 72.1 kg    Weight Hx: Wt Readings from Last 10 Encounters:  08/03/19 72.1 kg  08/02/19 79.4 kg  09/17/18 81.6 kg (81 %, Z= 0.89)*  05/02/18 83.9 kg (86 %, Z= 1.08)*  06/09/17 79.4 kg (82 %, Z= 0.90)*  10/05/14 75.8 kg (90 %, Z= 1.29)*   * Growth percentiles are based on CDC (Boys, 2-20 Years) data.    BMI:  Body mass index is 21.56 kg/m. Pt meets criteria for normal based on current BMI.  Estimated Nutritional Needs: Kcal: 25-30 kcal/kg Protein: > 1 gram protein/kg Fluid: 1 ml/kcal  Diet Order:  Diet Order            Diet regular Room service appropriate? Yes; Fluid consistency: Thin  Diet effective now             Pt is also offered choice of unit snacks mid-morning and mid-afternoon.  Pt is eating as desired.   Lab results and medications reviewed.   Tilda Franco, MS, RD, LDN Inpatient Clinical Dietitian Pager: [phone] After Hours Pager: [phone]

## 2019-08-05 LAB — HEMOGLOBIN A1C
Hgb A1c MFr Bld: 5.3 % (ref 4.8–5.6)
Mean Plasma Glucose: 105.41 mg/dL

## 2019-08-05 LAB — LIPID PANEL
Cholesterol: 168 mg/dL (ref 0–200)
HDL: 44 mg/dL (ref >40)
LDL Cholesterol: 114 mg/dL — ABNORMAL HIGH (ref 0–99)
Total CHOL/HDL Ratio: 3.8 RATIO
Triglycerides: 51 mg/dL (ref <150)
VLDL: 10 mg/dL (ref 0–40)

## 2019-08-05 NOTE — Progress Notes (Signed)
Recreation Therapy Notes  Date:  1.20.21 Time: 0930 Location: [address] Dayroom  Group Topic: Stress Management  Goal Area(s) Addresses:  Patient will identify positive stress management techniques. Patient will identify benefits of using stress management post d/c.  Intervention: Stress Management  Activity :  Progressive Muscle Relaxation.  LRT introduced the stress management technique of progressive muscle relaxation.  LRT read a script that guided patients through each individual muscle group to tense them and then relax them.  Patients were to listen to follow along as LRT read script.  Education:  Stress Management, Discharge Planning.   Education Outcome: Acknowledges Education  Clinical Observations/Feedback: Pt did not attend group activity.    Rasheka Denard, LRT/CTRS         Nikhita Mentzel A 08/05/2019 10:33 AM 

## 2019-08-05 NOTE — Progress Notes (Signed)
   08/05/19 2354  Psych Admission Type (Psych Patients Only)  Admission Status Voluntary  Psychosocial Assessment  Patient Complaints Depression;Other (Comment) (sleeping a lot)  Eye Contact Fair  Facial Expression Animated  Affect Depressed  Speech Logical/coherent  Interaction Assertive  Motor Activity Other (Comment) (WNL)  Appearance/Hygiene Unremarkable;In scrubs  Behavior Characteristics Cooperative  Mood Pleasant  Thought Process  Coherency WDL  Content WDL  Delusions WDL  Perception WDL  Hallucination None reported or observed  Judgment WDL  Confusion None  Danger to Self  Current suicidal ideation? Denies  Self-Injurious Behavior No self-injurious ideation or behavior indicators observed or expressed   Agreement Not to Harm Self Yes  Description of Agreement verbal  Danger to Others  Danger to Others None reported or observed   Pt active in milieu in dayroom. Pt denies SI, HI, AVH. Endorses pain 8/10 in left knee from the way that he slept in the bed. Meds given for that. Pt endorses sleeping a lot due to depression. Pt contracts for safety.

## 2019-08-05 NOTE — Progress Notes (Signed)
D: Patient presents with flat affect and depressed mood in the morning. Mood and affect improved as the day progressed. Per patient self inventory, slept fair last night with the help of medication. Fair appetite and normal energy levels. Rates depression #8, hopelessness #6, and anxiety #10. Denies withdrawal symptoms. Denies SI, HI, A/V hallucinations. Contracts for safety. Denies physical problems or pain. Goal today is "being positive". Plans on achieving this goal by "more action less talking". Pt was encouraged to stay out of his room/bed, attend groups, and become involved in therapeutic milieu.  A: Pt provided with support and encouragement. Encouraged to ask questions and express concerns if they arise. Pt cooperative with medications.  R: Safety maintained with 15 minute checks.

## 2019-08-05 NOTE — Progress Notes (Signed)
   08/05/19 0043  Psych Admission Type (Psych Patients Only)  Admission Status Voluntary  Psychosocial Assessment  Patient Complaints None  Eye Contact Fair  Facial Expression Animated  Affect Appropriate to circumstance  Speech Logical/coherent  Interaction Assertive  Appearance/Hygiene Unremarkable  Behavior Characteristics Cooperative  Mood Depressed;Anxious  Thought Process  Coherency WDL  Content WDL  Delusions WDL  Perception WDL  Hallucination None reported or observed  Judgment WDL  Confusion WDL  Danger to Self  Current suicidal ideation? Denies  Danger to Others  Danger to Others None reported or observed  Patient hyperverbal tonight but reports mood better at this time. No active SI or HI. Reports taking multiple showers today because of germs. No inappropriate behavior observed.

## 2019-08-05 NOTE — Tx Team (Signed)
Interdisciplinary Treatment and Diagnostic Plan Update  08/05/2019 Time of Session:  Christopher [name] MRN: [id number]  Principal Diagnosis: Adjustment disorder with mixed anxiety and depressed mood  Secondary Diagnoses: Principal Problem:   Adjustment disorder with mixed anxiety and depressed mood Active Problems:   Homicidal ideations   Acute adjustment disorder with mixed anxiety and depressed mood   MDD (major depressive disorder), single episode, severe with psychosis (HCC)   Current Medications:  Current Facility-Administered Medications  Medication Dose Route Frequency Provider Last Rate Last Admin  . acetaminophen (TYLENOL) tablet 650 mg  650 mg Oral Q6H PRN Rankin, Shuvon B, NP      . alum & mag hydroxide-simeth (MAALOX/MYLANTA) 200-200-20 MG/5ML suspension 30 mL  30 mL Oral Q4H PRN Rankin, Shuvon B, NP      . feeding supplement (ENSURE ENLIVE) (ENSURE ENLIVE) liquid 237 mL  237 mL Oral BID BM Antonieta Pert, MD   237 mL at 08/05/19 1009  . hydrOXYzine (ATARAX/VISTARIL) tablet 25 mg  25 mg Oral TID PRN Rankin, Shuvon B, NP   25 mg at 08/03/19 1845  . magnesium hydroxide (MILK OF MAGNESIA) suspension 30 mL  30 mL Oral Daily PRN Rankin, Shuvon B, NP      . nicotine (NICODERM CQ - dosed in mg/24 hours) patch 14 mg  14 mg Transdermal Daily Nira Conn A, NP   14 mg at 08/05/19 0858  . QUEtiapine (SEROQUEL) tablet 100 mg  100 mg Oral QHS Rankin, Shuvon B, NP   100 mg at 08/04/19 2122  . sertraline (ZOLOFT) tablet 50 mg  50 mg Oral Daily Cobos, Rockey Situ, MD   50 mg at 08/05/19 9381   PTA Medications: Medications Prior to Admission  Medication Sig Dispense Refill Last Dose  . acetaminophen (TYLENOL) 325 MG tablet Take 650 mg by mouth every 6 (six) hours as needed for mild pain or headache.     . ibuprofen (ADVIL) 400 MG tablet Take 400 mg by mouth every 6 (six) hours as needed for headache or mild pain.       Patient Stressors: Loss of grandfather Traumatic event  Patient  Strengths: Ability for insight Active sense of humor Average or above average intelligence Communication skills Motivation for treatment/growth Physical Health Supportive family/friends  Treatment Modalities: Medication Management, Group therapy, Case management,  1 to 1 session with clinician, Psychoeducation, Recreational therapy.   Physician Treatment Plan for Primary Diagnosis: Adjustment disorder with mixed anxiety and depressed mood Long Term Goal(s): Improvement in symptoms so as ready for discharge Improvement in symptoms so as ready for discharge   Short Term Goals: Ability to identify changes in lifestyle to reduce recurrence of condition will improve Ability to verbalize feelings will improve Ability to disclose and discuss suicidal ideas Ability to demonstrate self-control will improve Ability to identify and develop effective coping behaviors will improve Ability to maintain clinical measurements within normal limits will improve Ability to identify triggers associated with substance abuse/mental health issues will improve Ability to identify changes in lifestyle to reduce recurrence of condition will improve Ability to verbalize feelings will improve Ability to disclose and discuss suicidal ideas Ability to demonstrate self-control will improve Ability to identify and develop effective coping behaviors will improve Ability to maintain clinical measurements within normal limits will improve Ability to identify triggers associated with substance abuse/mental health issues will improve  Medication Management: Evaluate patient's response, side effects, and tolerance of medication regimen.  Therapeutic Interventions: 1 to 1 sessions, Unit Group sessions and  Medication administration.  Evaluation of Outcomes: Progressing  Physician Treatment Plan for Secondary Diagnosis: Principal Problem:   Adjustment disorder with mixed anxiety and depressed mood Active Problems:    Homicidal ideations   Acute adjustment disorder with mixed anxiety and depressed mood   MDD (major depressive disorder), single episode, severe with psychosis ([place])  Long Term Goal(s): Improvement in symptoms so as ready for discharge Improvement in symptoms so as ready for discharge   Short Term Goals: Ability to identify changes in lifestyle to reduce recurrence of condition will improve Ability to verbalize feelings will improve Ability to disclose and discuss suicidal ideas Ability to demonstrate self-control will improve Ability to identify and develop effective coping behaviors will improve Ability to maintain clinical measurements within normal limits will improve Ability to identify triggers associated with substance abuse/mental health issues will improve Ability to identify changes in lifestyle to reduce recurrence of condition will improve Ability to verbalize feelings will improve Ability to disclose and discuss suicidal ideas Ability to demonstrate self-control will improve Ability to identify and develop effective coping behaviors will improve Ability to maintain clinical measurements within normal limits will improve Ability to identify triggers associated with substance abuse/mental health issues will improve     Medication Management: Evaluate patient's response, side effects, and tolerance of medication regimen.  Therapeutic Interventions: 1 to 1 sessions, Unit Group sessions and Medication administration.  Evaluation of Outcomes: Progressing   RN Treatment Plan for Primary Diagnosis: Adjustment disorder with mixed anxiety and depressed mood Long Term Goal(s): Knowledge of disease and therapeutic regimen to maintain health will improve  Short Term Goals: Ability to participate in decision making will improve, Ability to verbalize feelings will improve, Ability to disclose and discuss suicidal ideas, Ability to identify and develop effective coping behaviors will  improve and Compliance with prescribed medications will improve  Medication Management: RN will administer medications as ordered by provider, will assess and evaluate patient's response and provide education to patient for prescribed medication. RN will report any adverse and/or side effects to prescribing provider.  Therapeutic Interventions: 1 on 1 counseling sessions, Psychoeducation, Medication administration, Evaluate responses to treatment, Monitor vital signs and CBGs as ordered, Perform/monitor CIWA, COWS, AIMS and Fall Risk screenings as ordered, Perform wound care treatments as ordered.  Evaluation of Outcomes: Progressing   LCSW Treatment Plan for Primary Diagnosis: Adjustment disorder with mixed anxiety and depressed mood Long Term Goal(s): Safe transition to appropriate next level of care at discharge, Engage patient in therapeutic group addressing interpersonal concerns.  Short Term Goals: Engage patient in aftercare planning with referrals and resources  Therapeutic Interventions: Assess for all discharge needs, 1 to 1 time with Social worker, Explore available resources and support systems, Assess for adequacy in community support network, Educate family and significant other(s) on suicide prevention, Complete Psychosocial Assessment, Interpersonal group therapy.  Evaluation of Outcomes: Progressing   Progress in Treatment: Attending groups: Yes. Participating in groups: Yes. Taking medication as prescribed: Yes. Toleration medication: Yes. Family/Significant other contact made: No, will contact:  no one, patient declined consent Patient understands diagnosis: Yes. Discussing patient identified problems/goals with staff: Yes. Medical problems stabilized or resolved: Yes. Denies suicidal/homicidal ideation: Yes. Issues/concerns per patient self-inventory: No. Other:   New problem(s) identified: None   New Short Term/Long Term Goal(s): medication stabilization,  elimination of SI thoughts, development of comprehensive mental wellness plan.    Patient Goals:    Discharge Plan or Barriers: Patient plans to return home with his parents. He was  agreeable to follow up with [facility] for medication management and therapy services.   Reason for Continuation of Hospitalization: Anxiety Depression Medication stabilization  Estimated Length of Stay: 2-3 days   Attendees: Patient: 08/05/2019 1:32 PM  Physician: Dr. Nehemiah Massed, MD 08/05/2019 1:32 PM  Nursing: Darl Pikes RN 08/05/2019 1:32 PM  RN Care Manager: 08/05/2019 1:32 PM  Social Worker: Baldo Daub, LCSW 08/05/2019 1:32 PM  Recreational Therapist:  08/05/2019 1:32 PM  Other:  08/05/2019 1:32 PM  Other:  08/05/2019 1:32 PM  Other: 08/05/2019 1:32 PM    Scribe for Treatment Team: Maeola Sarah, LCSWA 08/05/2019 1:32 PM

## 2019-08-05 NOTE — Progress Notes (Addendum)
[facility] MD Progress Note  08/05/2019 1:14 PM Christopher [name]  MRN:  [id number] Subjective: patient reports that he is feeling " a little better " but still depressed . Denies suicidal ideations at this time. He reports he felt less drowsy this morning ( and currently presents fully alert and attentive).   Objective: Have discussed case with treatment team and met with patient. 19 year old male, no past psychiatric history, presented to ED reporting vague homicidal ideations but also endorsing depression, neurovegetative symptoms.  Reports has been depressed since his grandfather passed away earlier last summer.  He also reported vague paranoid ideations that people were trying to hurt him or kill him. Reports daily cannabis use.  Patient reports he is feeling better than on admission but describes lingering depression /sadness . Reports he has been struggling with sadness and depression after his father died last Summer. He reports he also had an episode of depression when his grandmother passed away when he was a child .  Denies suicidal ideations. Tolerating medications well- had reported some excessive AM sedation but states today was not as sleepy and at this time presents fully alert and attentive. Denies medication side effects. Labs - Lipid panel unremarkable ( LDL slightly elevated at 114, serum glucose 105.4, HgbA1C 5.3  Behavior on unit calm and in good control, polite on approach. Limited milieu/group participation but today more visible in day room/unit . Principal Problem: MDD Diagnosis: Principal Problem:   Adjustment disorder with mixed anxiety and depressed mood Active Problems:   Homicidal ideations   Acute adjustment disorder with mixed anxiety and depressed mood   MDD (major depressive disorder), single episode, severe with psychosis ([place])  Total Time spent with patient: 15 minutes  Past Psychiatric History:   Past Medical History: History reviewed. No pertinent past medical  history. History reviewed. No pertinent surgical history. Family History: History reviewed. No pertinent family history. Family Psychiatric  History:  Social History:  Social History   Substance and Sexual Activity  Alcohol Use No     Social History   Substance and Sexual Activity  Drug Use Yes  . Types: Marijuana    Social History   Socioeconomic History  . Marital status: Single    Spouse name: Not on file  . Number of children: Not on file  . Years of education: Not on file  . Highest education level: Not on file  Occupational History  . Not on file  Tobacco Use  . Smoking status: Current Every Day Smoker    Types: Cigars  . Smokeless tobacco: Never Used  Substance and Sexual Activity  . Alcohol use: No  . Drug use: Yes    Types: Marijuana  . Sexual activity: Not on file  Other Topics Concern  . Not on file  Social History Narrative  . Not on file   Social Determinants of Health   Financial Resource Strain:   . Difficulty of Paying Living Expenses: Not on file  Food Insecurity:   . Worried About [name] in the Last Year: Not on file  . Ran Out of Food in the Last Year: Not on file  Transportation Needs:   . Lack of Transportation (Medical): Not on file  . Lack of Transportation (Non-Medical): Not on file  Physical Activity:   . Days of Exercise per Week: Not on file  . Minutes of Exercise per Session: Not on file  Stress:   . Feeling of Stress : Not on file  Social  Connections:   . Frequency of Communication with Friends and Family: Not on file  . Frequency of Social Gatherings with Friends and Family: Not on file  . Attends Religious Services: Not on file  . Active Member of Clubs or Organizations: Not on file  . Attends Archivist Meetings: Not on file  . Marital Status: Not on file   Additional Social History:   Sleep: Good  Appetite:  Good  Current Medications: Current Facility-Administered Medications  Medication Dose  Route Frequency Provider Last Rate Last Admin  . acetaminophen (TYLENOL) tablet 650 mg  650 mg Oral Q6H PRN Rankin, Shuvon B, NP      . alum & mag hydroxide-simeth (MAALOX/MYLANTA) 200-200-20 MG/5ML suspension 30 mL  30 mL Oral Q4H PRN Rankin, Shuvon B, NP      . feeding supplement (ENSURE ENLIVE) (ENSURE ENLIVE) liquid 237 mL  237 mL Oral BID BM Sharma Covert, MD   237 mL at 08/05/19 1009  . hydrOXYzine (ATARAX/VISTARIL) tablet 25 mg  25 mg Oral TID PRN Rankin, Shuvon B, NP   25 mg at 08/03/19 1845  . magnesium hydroxide (MILK OF MAGNESIA) suspension 30 mL  30 mL Oral Daily PRN Rankin, Shuvon B, NP      . nicotine (NICODERM CQ - dosed in mg/24 hours) patch 14 mg  14 mg Transdermal Daily Lindon Romp A, NP   14 mg at 08/05/19 0858  . QUEtiapine (SEROQUEL) tablet 100 mg  100 mg Oral QHS Rankin, Shuvon B, NP   100 mg at 08/04/19 2122  . sertraline (ZOLOFT) tablet 50 mg  50 mg Oral Daily [place] Borchardt, Myer Peer, MD   50 mg at 08/05/19 0938    Lab Results:  Results for orders placed or performed during the hospital encounter of 08/03/19 (from the past 48 hour(s))  Lipid panel     Status: Abnormal   Collection Time: 08/05/19  6:30 AM  Result Value Ref Range   Cholesterol 168 0 - 200 mg/dL   Triglycerides 51 <150 mg/dL   HDL 44 >40 mg/dL   Total CHOL/HDL Ratio 3.8 RATIO   VLDL 10 0 - 40 mg/dL   LDL Cholesterol 114 (H) 0 - 99 mg/dL    Comment:        Total Cholesterol/HDL:CHD Risk Coronary Heart Disease Risk Table                     Men   Women  1/2 Average Risk   3.4   3.3  Average Risk       5.0   4.4  2 X Average Risk   9.6   7.1  3 X Average Risk  23.4   11.0        Use the calculated Patient Ratio above and the CHD Risk Table to determine the patient's CHD Risk.        ATP III CLASSIFICATION (LDL):  <100     mg/dL   Optimal  100-129  mg/dL   Near or Above                    Optimal  130-159  mg/dL   Borderline  160-189  mg/dL   High  >190     mg/dL   Very High Performed at  [facility] [address] [place], [place] [address]   Hemoglobin A1c     Status: None   Collection Time: 08/05/19  6:30 AM  Result Value Ref Range   Hgb A1c MFr Bld 5.3 4.8 - 5.6 %    Comment: (NOTE) Pre diabetes:          5.7%-6.4% Diabetes:              >6.4% Glycemic control for   <7.0% adults with diabetes    Mean Plasma Glucose 105.41 mg/dL    Comment: Performed at [facility] [address], [place], [place] [address]    Blood Alcohol level:  Lab Results  Component Value Date   ETH <10 63/87/5643    Metabolic Disorder Labs: Lab Results  Component Value Date   HGBA1C 5.3 08/05/2019   MPG 105.41 08/05/2019   No results found for: PROLACTIN Lab Results  Component Value Date   CHOL 168 08/05/2019   TRIG 51 08/05/2019   HDL 44 08/05/2019   CHOLHDL 3.8 08/05/2019   VLDL 10 08/05/2019   LDLCALC 114 (H) 08/05/2019    Physical Findings: AIMS:  , ,  ,  ,    CIWA:    COWS:     Musculoskeletal: Strength & Muscle Tone: within normal limits Gait & Station: normal Patient leans: N/A  Psychiatric Specialty Exam: Physical Exam  Review of Systems denies chest pain, no shortness of breath, no vomiting  Blood pressure 123/86, pulse 74, temperature 97.6 F (36.4 C), resp. rate 16, height 6' (1.829 m), weight 72.1 kg, SpO2 100 %.Body mass index is 21.56 kg/m.  General Appearance: improving grooming   Eye Contact:  fair, improves during session  Speech:  Normal Rate  Volume:  Normal  Mood:  reports some improvement compared to admission  Affect:  vaguely constricted , tends to improve during session, smiles at times appropriately during session  Thought Process:  Linear and Descriptions of Associations: Intact  Orientation:  Other:  Alert and attentive  Thought Content:  No hallucinations, no delusions expressed, does not appear internally preoccupied  Suicidal Thoughts:  No currently denies suicidal or self-injurious ideations   Homicidal Thoughts:  No  Memory:  Recent and remote grossly intact  Judgement:  Good  Insight:  improving  Psychomotor Activity:  Normal  Concentration:  Concentration: Good and Attention Span: Good  Recall:  Good  Fund of Knowledge:  Good  Language:  Good  Akathisia:  Negative  Handed:  Right  AIMS (if indicated):     Assets:  Desire for Improvement Resilience  ADL's:  Intact  Cognition:  WNL  Sleep:  Number of Hours: 5.75   Assessment:  19 year old male, no past psychiatric history, presented to ED reporting vague homicidal ideations but also endorsing depression, neurovegetative symptoms.  Reports has been depressed since his grandfather passed away earlier last summer.. Reports daily cannabis use.  Patient is presenting with partially improved mood and range of affect- acknowledges partial improvement but reports lingering sense of sadness and depression. He is tolerating medications well thus far and today does not endorse or present with drowsiness or sedation. States he feels optimistic about medications working well for him. Side effects have been reviewed .   Treatment Plan Summary: Daily contact with patient to assess and evaluate symptoms and progress in treatment, Medication management, Plan Inpatient treatment and Medication management as below  Treatment Plan reviewed as below today 1/20 Encourage group and milieu participation Continue Zoloft  50 mg daily for depression and anxiety Continue Seroquel 100 mg nightly for mood disorder/psychosis Continue Vistaril 25 mg every 8 hours as needed for anxiety Treatment  team working on disposition planning options Jenne Campus, MD 08/05/2019, 1:14 PM   Patient ID: Corene Cornea, male   DOB: [birth year], 19 y.o.   MRN: [id number]

## 2019-08-06 LAB — PROLACTIN: Prolactin: 27.1 ng/mL — ABNORMAL HIGH (ref 4.0–15.2)

## 2019-08-06 MED ORDER — QUETIAPINE FUMARATE 100 MG PO TABS: 100.0000 mg | ORAL_TABLET | Freq: Every day | ORAL | 0 refills | Status: DC

## 2019-08-06 MED ORDER — NICOTINE 14 MG/24HR TD PT24: 14.0000 mg | MEDICATED_PATCH | Freq: Every day | TRANSDERMAL | 0 refills | Status: DC

## 2019-08-06 MED ORDER — HYDROXYZINE HCL 25 MG PO TABS: 25.0000 mg | ORAL_TABLET | Freq: Three times a day (TID) | ORAL | 0 refills | Status: DC | PRN

## 2019-08-06 MED ORDER — SERTRALINE HCL 50 MG PO TABS: 50.0000 mg | ORAL_TABLET | Freq: Every day | ORAL | 0 refills | Status: DC

## 2019-08-06 NOTE — Discharge Summary (Addendum)
Physician Discharge Summary Note  Patient:  Christopher [name] is an 19 y.o., male  MRN:  [id number]  DOB:  2000-11-05  Patient phone:  [phone] (home)   Patient address:   [place] [address],   Total Time spent with patient: Greater than 30 minutes  Date of Admission:  08/03/2019  Date of Discharge: 08-06-19  Reason for Admission: Worsening symptoms of depression & homicidal ideations.  Principal Problem: Adjustment disorder with mixed anxiety and depressed mood  Discharge Diagnoses: Principal Problem:   Adjustment disorder with mixed anxiety and depressed mood Active Problems:   Homicidal ideations   Acute adjustment disorder with mixed anxiety and depressed mood   MDD (major depressive disorder), single episode, severe with psychosis ([place])  Past Psychiatric History: Mdd, Adjustment disorder.  Past Medical History: History reviewed. No pertinent past medical history. History reviewed. No pertinent surgical history.  Family History: History reviewed. No pertinent family history.  Family Psychiatric  History: See H&P  Social History:  Social History   Substance and Sexual Activity  Alcohol Use No     Social History   Substance and Sexual Activity  Drug Use Yes  . Types: Marijuana    Social History   Socioeconomic History  . Marital status: Single    Spouse name: Not on file  . Number of children: Not on file  . Years of education: Not on file  . Highest education level: Not on file  Occupational History  . Not on file  Tobacco Use  . Smoking status: Current Every Day Smoker    Types: Cigars  . Smokeless tobacco: Never Used  Substance and Sexual Activity  . Alcohol use: No  . Drug use: Yes    Types: Marijuana  . Sexual activity: Not on file  Other Topics Concern  . Not on file  Social History Narrative  . Not on file   Social Determinants of Health   Financial Resource Strain:   . Difficulty of Paying Living Expenses: Not on  file  Food Insecurity:   . Worried About [name] in the Last Year: Not on file  . Ran Out of Food in the Last Year: Not on file  Transportation Needs:   . Lack of Transportation (Medical): Not on file  . Lack of Transportation (Non-Medical): Not on file  Physical Activity:   . Days of Exercise per Week: Not on file  . Minutes of Exercise per Session: Not on file  Stress:   . Feeling of Stress : Not on file  Social Connections:   . Frequency of Communication with Friends and Family: Not on file  . Frequency of Social Gatherings with Friends and Family: Not on file  . Attends Religious Services: Not on file  . Active Member of Clubs or Organizations: Not on file  . Attends Archivist Meetings: Not on file  . Marital Status: Not on file   Hospital Course: (Per Md's admission evaluation): Patient is a 19 year old male with a negative past psychiatric history up to this point who presented to the [facility] emergency department on 08/02/2019 secondary to homicidal ideation. The patient was significantly tearful throughout the interview. The patient stated that he had been depressed since his grandfather died last summer. The patient stated that he is also upset because "everybody lies to me". He has tried to talk to his girlfriend, but she is brushed them off. He admitted to crying spells, helplessness, hopelessness, worthlessness  and insomnia. He also felt as though people were trying to harm him. He stated he is felt that way ever since he joined a gang. Collateral information from his father suggested that he had been distant and isolating himself recently. He also has a court date sometime at the end of this month for larceny and trespassing. He denied any previous attempts to harm himself. He denied any previous psychiatric medications, he denied any previous psychiatric admissions. He stated his family brought him to the emergency room because "all of  them had been admitted before for similar reasons". He was admitted to the hospital for evaluation and stabilization.  This is the first psychiatric admission/discharge summary for this 19 year old AA male. He was brought to the hospital ED for crisis management for the development of worsening symptoms of depression & homicidal ideations. He apparently has become very depressed since the death of his grand-father. Chart review indicated patient complained that even his girlfriend & his other family members were lying to him. There were no explanation as to what they were lying to him about. He was brought to the hospital for evaluation & mood stabilization treatments.    After evaluation of his presenting symptoms, Christopher [name] was recommended for mood stabilization treatments. The medication regimen for his presenting symptoms were discussed & with his consent initiated. He received, stabilized & was discharged on the medications as listed below on his discharge medication lists. He was also enrolled & participated in the group counseling sessions being offered & held on this unit. He learned coping skills. He presented on this admission, no other pre-existing medical condition that required treatment. He tolerated his treatment regimen without any adverse effects or reactions reported.   During the course of his hospitalization, the 15-minute checks were adequate to ensure Christopher [name]'s safety. Patient did not display any dangerous, violent or suicidal behavior on the unit.  He interacted with patients & staff appropriately, participated appropriately in the group sessions/therapies. His medications were addressed & adjusted to meet his needs. He was recommended for outpatient follow-up care & medication management upon discharge to assure his continuity of care.  At the time of discharge patient is not reporting any acute suicidal/homicidal ideations. He feels more confident about his self & mental health care. He  currently denies any new issues or concerns. Education & supportive counseling provided throughout her hospital stay & upon discharge.   Today upon his discharge evaluation with the attending psychiatrist, Christopher [name] shares he is doing well. He denies any other specific concerns. He is sleeping well. His appetite is good. He denies other physical complaints. He denies AH/VH. He feels that his medications have been helpful & is in agreement to continue his current treatment regimen as recommended. He was able to engage in safety planning including plan to return to [facility] or contact emergency services if he feels unable to maintain his own safety or the safety of others. Pt had no further questions, comments, or concerns. He left [facility] with all personal belongings in no apparent distress. Transportation per his family.  Physical Findings: AIMS:  , ,  ,  ,    CIWA:    COWS:     Musculoskeletal: Strength & Muscle Tone: within normal limits Gait & Station: normal Patient leans: N/A  Psychiatric Specialty Exam: Physical Exam  Nursing note and vitals reviewed. Constitutional: He is oriented to person, place, and time. He appears well-developed.  Cardiovascular: Normal rate.  Respiratory: Effort normal.  Genitourinary:  Genitourinary Comments: deferred   Musculoskeletal:        General: Normal range of motion.     Cervical back: Normal range of motion.  Neurological: He is alert and oriented to person, place, and time.  Skin: Skin is warm and dry.    Review of Systems  Constitutional: Negative for chills, diaphoresis and fever.  HENT: Negative for congestion, rhinorrhea and sore throat.   Respiratory: Negative for cough, shortness of breath and wheezing.   Cardiovascular: Negative for chest pain and palpitations.  Gastrointestinal: Negative for diarrhea and vomiting.  Genitourinary: Negative for difficulty urinating.  Skin: Negative for color change.  Allergic/Immunologic: Negative for  environmental allergies and food allergies.  Neurological: Negative for dizziness and headaches.  Psychiatric/Behavioral: Positive for dysphoric mood (Stabilized with medication prior to discharge) and hallucinations (Hx of (Stable)). Negative for agitation, behavioral problems, confusion, decreased concentration, self-injury, sleep disturbance (Stable) and suicidal ideas. The patient is not nervous/anxious (Stable) and is not hyperactive.     Blood pressure 123/86, pulse 74, temperature 97.6 F (36.4 C), resp. rate 16, height 6' (1.829 m), weight 72.1 kg, SpO2 100 %.Body mass index is 21.56 kg/m.  See Md's discharge SRA   Has this patient used any form of tobacco in the last 30 days? (Cigarettes, Smokeless Tobacco, Cigars, and/or Pipes): Yes, an FDA-approved tobacco cessation medication was recommended at discharge.  Blood Alcohol level:  Lab Results  Component Value Date   ETH <10 08/02/2019   Metabolic Disorder Labs:  Lab Results  Component Value Date   HGBA1C 5.3 08/05/2019   MPG 105.41 08/05/2019   Lab Results  Component Value Date   PROLACTIN 27.1 (H) 08/05/2019   Lab Results  Component Value Date   CHOL 168 08/05/2019   TRIG 51 08/05/2019   HDL 44 08/05/2019   CHOLHDL 3.8 08/05/2019   VLDL 10 08/05/2019   LDLCALC 114 (H) 08/05/2019   See Psychiatric Specialty Exam and Suicide Risk Assessment completed by Attending Physician prior to discharge.  Discharge destination:  Home  Is patient on multiple antipsychotic therapies at discharge:  No   Has Patient had three or more failed trials of antipsychotic monotherapy by history:  No  Recommended Plan for Multiple Antipsychotic Therapies: NA  Allergies as of 08/06/2019   No Known Allergies     Medication List    STOP taking these medications   acetaminophen 325 MG tablet Commonly known as: TYLENOL     TAKE these medications     Indication  hydrOXYzine 25 MG tablet Commonly known as: ATARAX/VISTARIL Take 1  tablet (25 mg total) by mouth 3 (three) times daily as needed for anxiety.  Indication: Feeling Anxious   ibuprofen 400 MG tablet Commonly known as: ADVIL Take 400 mg by mouth every 6 (six) hours as needed for headache or mild pain.  Indication: Fever, Pain, Headaches   nicotine 14 mg/24hr patch Commonly known as: NICODERM CQ - dosed in mg/24 hours Place 1 patch (14 mg total) onto the skin daily. (May buy from over the counter): For smoking cessation Start taking on: August 07, 2019  Indication: Nicotine Addiction   QUEtiapine 100 MG tablet Commonly known as: SEROQUEL Take 1 tablet (100 mg total) by mouth at bedtime. For mood control  Indication: Mood control   sertraline 50 MG tablet Commonly known as: ZOLOFT Take 1 tablet (50 mg total) by mouth daily. For depression Start taking on: August 07, 2019  Indication: Major Depressive Disorder  Follow-up recommendations: Activity:  As tolerated Diet: As recommended by your primary care doctor. Keep all scheduled follow-up appointments as recommended.   Comments: Prescriptions given at discharge.  Patient agreeable to plan.  Given opportunity to ask questions.  Appears to feel comfortable with discharge denies any current suicidal or homicidal thought. Patient is also instructed prior to discharge to: Take all medications as prescribed by his/her mental healthcare provider. Report any adverse effects and or reactions from the medicines to his/her outpatient provider promptly. Patient has been instructed & cautioned: To not engage in alcohol and or illegal drug use while on prescription medicines. In the event of worsening symptoms, patient is instructed to call the crisis hotline, 911 and or go to the nearest ED for appropriate evaluation and treatment of symptoms. To follow-up with his/her primary care provider for your other medical issues, concerns and or health care needs.  Signed: Armandina Stammer, NP, PMHNP, FNP-BC 08/06/2019,  8:52 AM   Patient seen, Suicide Assessment Completed.  Disposition Plan Reviewed

## 2019-08-06 NOTE — Progress Notes (Signed)
  [facility] Adult Case Management Discharge Plan :  Will you be returning to the same living situation after discharge:  Yes,  home. At discharge, do you have transportation home?: Yes,  family member picking up at 11:30am. Do you have the ability to pay for your medications: Yes,  Medicaid.  Release of information consent forms completed and in the chart; letter on chart.  Patient to Follow up at: Follow-up Information    [facility] Follow up on 08/13/2019.   Why: Your hospital discharge appointment will be held on 01/28 at 11:30am by phone. The provider will call you (may be from an unlisted number). Be sure to answer the call and have access to your hospital discharge paperwork for the appointment. Contact information: [address] [place] [address] [phone]           Next level of care provider has access to [facility] Link:no  Safety Planning and Suicide Prevention discussed: Yes,  with patient.  Has patient been referred to the Quitline?: N/A patient is not a smoker  Patient has been referred for addiction treatment: Yes  Darreld Mclean, LCSWA 08/06/2019, 10:25 AM

## 2019-08-06 NOTE — Plan of Care (Signed)
Discharge note  Patient verbalizes readiness for discharge. Follow up plan explained, AVS, Transition record and SRA given. Prescriptions and teaching provided. Belongings returned and signed for. Suicide safety plan completed and signed. Patient verbalizes understanding. Patient denies SI/HI and assures this Clinical research associate they will seek assistance should that change. Patient discharged to lobby where pt's friend was waiting.  Problem: Education: Goal: Knowledge of [facility] Education information/materials will improve Outcome: Adequate for Discharge Goal: Emotional status will improve Outcome: Adequate for Discharge Goal: Mental status will improve Outcome: Adequate for Discharge Goal: Verbalization of understanding the information provided will improve Outcome: Adequate for Discharge   Problem: Coping: Goal: Ability to identify and develop effective coping behavior will improve Outcome: Adequate for Discharge   Problem: Activity: Goal: Interest or engagement in leisure activities will improve Outcome: Adequate for Discharge Goal: Imbalance in normal sleep/wake cycle will improve Outcome: Adequate for Discharge   Problem: Coping: Goal: Will verbalize feelings Outcome: Adequate for Discharge   Problem: Health Behavior/Discharge Planning: Goal: Ability to make decisions will improve Outcome: Adequate for Discharge Goal: Compliance with therapeutic regimen will improve Outcome: Adequate for Discharge   Problem: Education: Goal: Ability to state activities that reduce stress will improve Outcome: Adequate for Discharge   Problem: Self-Concept: Goal: Ability to identify factors that promote anxiety will improve Outcome: Adequate for Discharge Goal: Level of anxiety will decrease Outcome: Adequate for Discharge Goal: Ability to modify response to factors that promote anxiety will improve Outcome: Adequate for Discharge   Problem: Education: Goal: Utilization of techniques  to improve thought processes will improve Outcome: Adequate for Discharge Goal: Knowledge of the prescribed therapeutic regimen will improve Outcome: Adequate for Discharge   Problem: Coping: Goal: Coping ability will improve Outcome: Adequate for Discharge   Problem: Role Relationship: Goal: Will demonstrate positive changes in social behaviors and relationships Outcome: Adequate for Discharge   Problem: Safety: Goal: Ability to disclose and discuss suicidal ideas will improve Outcome: Adequate for Discharge Goal: Ability to identify and utilize support systems that promote safety will improve Outcome: Adequate for Discharge   Problem: Self-Concept: Goal: Will verbalize positive feelings about self Outcome: Adequate for Discharge Goal: Level of anxiety will decrease Outcome: Adequate for Discharge   Problem: Education: Goal: Verbalization of understanding the information provided will improve Outcome: Adequate for Discharge   Problem: Coping: Goal: Level of anxiety will decrease Outcome: Adequate for Discharge Goal: Ability to identify and utilize appropriate coping strategies will improve Outcome: Adequate for Discharge Goal: Ability to identify and utilize available resources and services will improve Outcome: Adequate for Discharge Goal: Will verbalize feelings Outcome: Adequate for Discharge   Problem: Education: Goal: Ability to make informed decisions regarding treatment will improve Outcome: Adequate for Discharge   Problem: Coping: Goal: Coping ability will improve Outcome: Adequate for Discharge   Problem: Health Behavior/Discharge Planning: Goal: Identification of resources available to assist in meeting health care needs will improve Outcome: Adequate for Discharge   Problem: Medication: Goal: Compliance with prescribed medication regimen will improve Outcome: Adequate for Discharge   Problem: Self-Concept: Goal: Ability to disclose and discuss  suicidal ideas will improve Outcome: Adequate for Discharge Goal: Will verbalize positive feelings about self Outcome: Adequate for Discharge

## 2019-08-06 NOTE — [facility] Suicide Risk Assessment (Addendum)
[facility] Discharge Suicide Risk Assessment   Principal Problem: Adjustment disorder with mixed anxiety and depressed mood Discharge Diagnoses: Principal Problem:   Adjustment disorder with mixed anxiety and depressed mood Active Problems:   Homicidal ideations   Acute adjustment disorder with mixed anxiety and depressed mood   MDD (major depressive disorder), single episode, severe with psychosis ([place])   Total Time spent with patient: 30 minutes  Musculoskeletal: Strength & Muscle Tone: within normal limits Gait & Station: normal Patient leans: N/A  Psychiatric Specialty Exam: Review of Systems no headache, no chest pain, no shortness of breath, no nausea or vomiting, denies drowsiness and presents fully alert and attentive   Blood pressure 123/86, pulse 74, temperature 97.6 F (36.4 C), resp. rate 16, height 6' (1.829 m), weight 72.1 kg, SpO2 100 %.Body mass index is 21.56 kg/m.  General Appearance: Well Groomed  Eye Contact::  Good  Speech:  Normal Rate409  Volume:  Normal  Mood:  reports improved mood and currently characterizes as 8/10 with 10 being best   Affect:  Appropriate and reactive  Thought Process:  Linear and Descriptions of Associations: Intact  Orientation:  Full (Time, Place, and Person)  Thought Content:  no hallucinations, no delusions, not internally preoccupied   Suicidal Thoughts:  No denies suicidal or self injurious ideations, no homicidal or violent ideations  Homicidal Thoughts:  No  Memory:  recent and remote grossly intact   Judgement:  Other:  improving   Insight:  fair- improving   Psychomotor Activity:  Normal  Concentration:  Good  Recall:  Good  Fund of Knowledge:Good  Language: Good  Akathisia:  Negative  Handed:  Right  AIMS (if indicated):   no abnormal or involuntary movements noted or reported  Assets:  Communication Skills Desire for Improvement Resilience Social Support  Sleep:  Number of Hours: 6.5  Cognition: WNL  ADL's:  Intact    Mental Status Per Nursing Assessment::   On Admission:  NA  Demographic Factors:  19 year old male, no children, lives with his father  Loss Factors: Death of grandfather last year, relationship stressors with GF   Historical Factors: No prior psychiatric admissions, no past history of suicide attempts, reports depression following death of grandfather last year .  Risk Reduction Factors:   Living with another person, especially a relative, Positive social support and Positive coping skills or problem solving skills  Continued Clinical Symptoms:  At this time patient is alert and attentive, reports feeling" a lot better today" and denies feeling depressed at this time ,affect is reactive and fuller in range, no thought disorder, no suicidal or self injurious ideations , no homicidal or violent ideations, no hallucinations,no delusions, future oriented, for example spoke about his plans to eventually get into culinary school. Denies medication side effects- which we reviewed, including risk of weight gain, sedation, motor and metabolic disturbances related to Seroquel, and Zoloft related  risk of sexual dysfunction and potential increased suicidal ideations early in treatment with antidepressants in young adults. Behavior on unit calm and in good control, polite on approach. With patient's express consent I spoke with his father on the phone Christopher [name] M.) who corroborated patient presents improved, at baseline , and is in agreement with discharge today.   Cognitive Features That Contribute To Risk:  No gross cognitive deficits noted upon discharge. Is alert , attentive, and oriented x 3   Suicide Risk:  Mild:  Suicidal ideation of limited frequency, intensity, duration, and specificity.  There  are no identifiable plans, no associated intent, mild dysphoria and related symptoms, good self-control (both objective and subjective assessment), few other risk factors, and identifiable protective  factors, including available and accessible social support.    Plan Of Care/Follow-up recommendations:  Activity:  as tolerated  Diet:  regular Tests:  NA Other:  See below  Patient is requesting discharge, there are no grounds for involuntary commitment at this time, he is leaving in good spirits, plans to return home.   Christopher Cotta, MD 08/06/2019, 7:50 AM

## 2019-10-20 ENCOUNTER — Emergency Department (HOSPITAL_COMMUNITY)
Admission: EM | Admit: 2019-10-20 | Discharge: 2019-10-21 | Disposition: A | Discharge status: Home / Self Care | Payer: Medicaid Other | Attending: Emergency Medicine | Admitting: Emergency Medicine

## 2019-10-20 DIAGNOSIS — T671XXA Heat syncope, initial encounter: Secondary | ICD-10-CM | POA: Insufficient documentation

## 2019-10-20 DIAGNOSIS — W102XXA Fall (on)(from) incline, initial encounter: Secondary | ICD-10-CM

## 2019-10-20 DIAGNOSIS — F17290 Nicotine dependence, other tobacco product, uncomplicated: Secondary | ICD-10-CM | POA: Diagnosis not present

## 2019-10-20 DIAGNOSIS — W1830XA Fall on same level, unspecified, initial encounter: Secondary | ICD-10-CM | POA: Diagnosis not present

## 2019-10-20 DIAGNOSIS — M79641 Pain in right hand: Secondary | ICD-10-CM | POA: Insufficient documentation

## 2019-10-21 ENCOUNTER — Emergency Department (HOSPITAL_COMMUNITY): Payer: Medicaid Other

## 2019-10-21 ENCOUNTER — Encounter (HOSPITAL_COMMUNITY): Payer: Self-pay | Admitting: Emergency Medicine

## 2019-10-21 ENCOUNTER — Other Ambulatory Visit: Payer: Self-pay

## 2019-10-21 LAB — I-STAT CHEM 8, ED
BUN: 16 mg/dL (ref 6–20)
Calcium, Ion: 1.25 mmol/L (ref 1.15–1.40)
Chloride: 103 mmol/L (ref 98–111)
Creatinine, Ser: 1 mg/dL (ref 0.61–1.24)
Glucose, Bld: 84 mg/dL (ref 70–99)
HCT: 47 % (ref 39.0–52.0)
Hemoglobin: 16 g/dL (ref 13.0–17.0)
Potassium: 3.8 mmol/L (ref 3.5–5.1)
Sodium: 139 mmol/L (ref 135–145)
TCO2: 30 mmol/L (ref 22–32)

## 2019-10-21 MED ORDER — SODIUM CHLORIDE 0.9 % IV BOLUS
1000.0000 mL | Freq: Once | INTRAVENOUS | Status: AC
  Administered 2019-10-21: 1000 mL via INTRAVENOUS

## 2019-10-21 MED ORDER — KETOROLAC TROMETHAMINE 30 MG/ML IJ SOLN
30.0000 mg | Freq: Once | INTRAMUSCULAR | Status: AC
  Administered 2019-10-21: 06:00:00 30 mg via INTRAVENOUS
  Filled 2019-10-21: qty 1

## 2019-10-21 NOTE — ED Triage Notes (Signed)
Pt sleepy on triage very poor historian, c/o right hand pain, denies any injury.

## 2019-10-21 NOTE — Progress Notes (Signed)
Orthopedic Tech Progress Note Patient Details:  Christopher [name] 2000-09-03 [id number]  Ortho Devices Type of Ortho Device: Arm sling, Ulna gutter splint Ortho Device/Splint Location: rue. with fingers left straight at [employer] request. Ortho Device/Splint Interventions: Ordered, Application, Adjustment   Post Interventions Patient Tolerated: Well Instructions Provided: Care of device, Adjustment of device   Trinna Post 10/21/2019, 6:25 AM

## 2019-10-21 NOTE — Discharge Instructions (Addendum)
Thank you for allowing me to care for you today in the Emergency Department.   You were seen today in the emergency department for right hand pain after a fall.  You also likely had a syncopal episode due to heat exhaustion.  It is very important that you drink plenty of fluids if you are working outside in the heat.  Make sure that you are drinking at least 64 ounces of fluids per day, increase this when you are working outside in the heat.  Your x-ray did not show a broken bone on your hand, but some injuries may not show up on initial x-ray.  Based on your exam, you were placed in a splint for a possible broken bone.  I recommend that you call Dr. Carlos Levering office to schedule a follow-up appointment.  They will likely rex-ray her hand at that visit.  Until you are seen by hand surgery, keep the splint on your hands.  Keep it clean and dry.  To bathe or shower, cover with a plastic bag.  Take 650 mg of Tylenol or 600 mg of ibuprofen with food every 6 hours for pain.  You can alternate between these 2 medications every 3 hours if your pain returns.  For instance, you can take Tylenol at noon, followed by a dose of ibuprofen at 3, followed by second dose of Tylenol and 6.  Try keeping your hand elevated to help with pain.  Return to the emergency department if you have another episode where you pass out, if you have any fall or injury, if your hand, arm, or fingers get significantly swollen, turn red, if you have worsening numbness or weakness, or other new, concerning symptoms.

## 2019-10-21 NOTE — ED Provider Notes (Signed)
[facility] EMERGENCY DEPARTMENT Provider Note   CSN: [id number] Arrival date & time: 10/20/19  2336     History Chief Complaint  Patient presents with  . Loss of Consciousness    Davie Claud is a 19 y.o. male history of depression who presents to the emergency department with a chief complaint of right hand pain.  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  He reports minimal fluid intake during that time.  He began to get extremely thirsty, dizzy, and lightheaded and "fell out" at work.  He states this was unwitnessed by coworkers.  He reports that after the syncopal episode that he was on the ground.  He denies hitting his head.  He was able to get back up and tried drinking water to improve his symptoms.  Within the next 2 hours, he reports that he was walking down a loading ramp when he started to get lightheaded again and tripped over his shoe while walking down the ramp.  He reports that he put his right hand out to try to grab the wooden railing, but missed and instead ended up punching his right hand into the side of the railing.  He then fell to the ground on the ramp.  In the ER, he is endorsing pain to the right hand and right fifth finger.  He is endorsing numbness in the right fifth finger and weakness.  Reports he is unable to fully extend the digit due to weakness.  He denies right wrist, elbow, or shoulder pain.  He denies hitting his head.  No headache, visual changes, nausea, vomiting, muscle cramps or spasms, back or neck pain, shortness of breath, chest pain, or abdominal pain.  He reports that lightheadedness has since resolved.  He has had decreased urine output today.   He is right-hand dominant.  He is a current everyday smoker.  He denies alcohol use.  Denies illicit or recreational substance use.  The history is provided by the patient. No language interpreter was used.       History reviewed. No  pertinent past medical history.  Patient Active Problem List   Diagnosis Date Noted  . Adjustment disorder with mixed anxiety and depressed mood 08/03/2019  . Homicidal ideations 08/03/2019  . Acute adjustment disorder with mixed anxiety and depressed mood 08/03/2019  . MDD (major depressive disorder), single episode, severe with psychosis (HCC) 08/03/2019    History reviewed. No pertinent surgical history.     No family history on file.  Social History   Tobacco Use  . Smoking status: Current Every Day Smoker    Types: Cigars  . Smokeless tobacco: Never Used  Substance Use Topics  . Alcohol use: No  . Drug use: Yes    Types: Marijuana    Home Medications Prior to Admission medications   Medication Sig Start Date End Date Taking? Authorizing Provider  QUEtiapine (SEROQUEL) 100 MG tablet Take 1 tablet (100 mg total) by mouth at bedtime. For mood control Patient not taking: Reported on 10/21/2019 08/06/19 10/21/19  Armandina Stammer I, NP  sertraline (ZOLOFT) 50 MG tablet Take 1 tablet (50 mg total) by mouth daily. For depression Patient not taking: Reported on 10/21/2019 08/07/19 10/21/19  Armandina Stammer I, NP    Allergies    Seroquel [quetiapine], Vistaril [hydroxyzine], and Zoloft [sertraline]  Review of Systems   Review of Systems  Constitutional: Negative for appetite change, chills  and fever.  Eyes: Negative for visual disturbance.  Respiratory: Negative for cough, shortness of breath and wheezing.   Cardiovascular: Negative for chest pain and palpitations.  Gastrointestinal: Negative for abdominal pain, diarrhea, nausea and vomiting.  Genitourinary: Positive for decreased urine volume. Negative for dysuria and flank pain.  Musculoskeletal: Positive for arthralgias and myalgias. Negative for back pain, gait problem, joint swelling, neck pain and neck stiffness.  Skin: Negative for rash and wound.  Allergic/Immunologic: Negative for immunocompromised state.  Neurological:  Positive for syncope, weakness, light-headedness and numbness. Negative for dizziness, tremors, seizures, speech difficulty and headaches.  Psychiatric/Behavioral: Negative for confusion.    Physical Exam Updated Vital Signs BP 113/63 (BP Location: Left Arm)   Pulse 80   Temp 98 F (36.7 C) (Oral)   Resp 19   SpO2 99%   Physical Exam Vitals and nursing note reviewed.  Constitutional:      Appearance: He is well-developed. He is not ill-appearing or toxic-appearing.     Comments: Sleeping on entry to the room.  Arouses easily to voice.  HENT:     Head: Normocephalic.  Eyes:     Conjunctiva/sclera: Conjunctivae normal.  Cardiovascular:     Rate and Rhythm: Normal rate and regular rhythm.     Heart sounds: No murmur.  Pulmonary:     Effort: Pulmonary effort is normal. No respiratory distress.     Breath sounds: No stridor. No wheezing, rhonchi or rales.  Chest:     Chest wall: No tenderness.  Abdominal:     General: There is no distension.     Palpations: Abdomen is soft. There is no mass.     Tenderness: There is no abdominal tenderness. There is no right CVA tenderness, left CVA tenderness, guarding or rebound.     Hernia: No hernia is present.  Musculoskeletal:        General: Tenderness present.     Cervical back: Neck supple.     Comments: Diffusely tender to palpation to the right fifth digit.  He is unable to fully extend the digit at the PIP joint.  Good strength against resistance with flexion.  He is tender to palpation over the fifth MCP as well as the proximal phalanx.  He is tender just proximal to the fifth MTP on the metacarpals.  No point tenderness. Good capillary refill of the digit.  Decreased sensation with soft and sharp touch to all 4 tips of the right fifth digit.  Digit is well perfused.  No overlying erythema, edema, or warmth.  No overlying wounds.  No obvious deformities or crepitus.  Normal exam of the right wrist.  Skin:    General: Skin is warm and  dry.  Neurological:     Mental Status: He is alert.     Comments: Alert and oriented x3.  Cranial nerves II through XII are grossly intact.  5 out of 5 strength against resistance of the large muscle groups of the bilateral upper and lower extremities.  No pronator drift.  Ambulates without ataxia.  Psychiatric:        Behavior: Behavior normal.     ED Results / Procedures / Treatments   Labs (all labs ordered are listed, but only abnormal results are displayed) Labs Reviewed  I-STAT CHEM 8, ED    EKG None  Radiology DG Hand Complete Right  Result Date: 10/21/2019 CLINICAL DATA:  Right hand pain. EXAM: RIGHT HAND - COMPLETE 3+ VIEW COMPARISON:  October 05, 2014 FINDINGS: There is  no evidence of fracture or dislocation. There is no evidence of arthropathy or other focal bone abnormality. Soft tissues are unremarkable. IMPRESSION: Negative. Electronically Signed   By: Aram Candela M.D.   On: 10/21/2019 02:09    Procedures Procedures (including critical care time)  Medications Ordered in ED Medications  sodium chloride 0.9 % bolus 1,000 mL (0 mLs Intravenous Stopped 10/21/19 0638)  ketorolac (TORADOL) 30 MG/ML injection 30 mg (30 mg Intravenous Given 10/21/19 0548)    ED Course  I have reviewed the triage vital signs and the nursing notes.  Pertinent labs & imaging results that were available during my care of the patient were reviewed by me and considered in my medical decision making (see chart for details).    MDM Rules/Calculators/A&P                      19 year old male who presents to the emergency department after having a syncopal versus near syncope episode at work.  Reports that he worked out in the heat for many hours and then became lightheaded when he "fell out" that was unwitnessed by coworkers.  He reports poor fluid intake for most of the day while he was out in the heat.  After the episode, he was walking down a ramp when he started to feel lightheaded again  and tripped over his left shoe.  He attempted to grab the wooden railing, but instead his hand and punched against the side of the railing.  He is endorsing pain in lateral aspect of the right hand and the right fifth digit.  He is unable to fully extend the right fifth digit.  He has decreased sensation along all 4 distal aspects of the right fifth digit.  The finger is well-perfused.  No obvious deformities.   X-ray of the right hand was reviewed by me.  No obvious fracture.  However, given his exam, I am concerned for occult boxer's fracture on the right.  We will treat with ulnar gutter splint and give referral to hand surgery for follow-up.  In regard to his syncopal episode versus near syncope episode,  I have a low suspicion for rhabdomyolysis.  Doubt seizure.  He is not having any muscle cramps.  His vital signs are normal.  His age is atraumatic.  Will check a creatinine level and hydrate with fluids given the extensive time that he was out in the heat today.   Labs are normal.  No change in neurovascular status after ulnar gutter splint has been applied.  ER return precautions given.  All questions answered.  He is hemodynamically stable no acute distress.  Safe for discharge home with outpatient follow-up with hand surgery.  Final Clinical Impression(s) / ED Diagnoses Final diagnoses:  Heat syncope, initial encounter  Fall (on)(from) incline, initial encounter  Right hand pain    Rx / DC Orders ED Discharge Orders    None       Ida Uppal A, PA-C 10/21/19 0747    Dione Booze, MD 10/21/19 2330

## 2019-11-08 ENCOUNTER — Encounter (HOSPITAL_COMMUNITY): Payer: Self-pay | Admitting: Emergency Medicine

## 2019-11-08 ENCOUNTER — Other Ambulatory Visit: Payer: Self-pay

## 2019-11-08 ENCOUNTER — Emergency Department (HOSPITAL_COMMUNITY)
Admission: EM | Admit: 2019-11-08 | Discharge: 2019-11-08 | Disposition: A | Discharge status: Home / Self Care | Payer: Medicaid Other | Attending: Emergency Medicine | Admitting: Emergency Medicine

## 2019-11-08 DIAGNOSIS — F331 Major depressive disorder, recurrent, moderate: Secondary | ICD-10-CM | POA: Diagnosis present

## 2019-11-08 DIAGNOSIS — F17290 Nicotine dependence, other tobacco product, uncomplicated: Secondary | ICD-10-CM | POA: Diagnosis not present

## 2019-11-08 DIAGNOSIS — M79641 Pain in right hand: Secondary | ICD-10-CM | POA: Insufficient documentation

## 2019-11-08 DIAGNOSIS — F1290 Cannabis use, unspecified, uncomplicated: Secondary | ICD-10-CM | POA: Diagnosis not present

## 2019-11-08 DIAGNOSIS — R45851 Suicidal ideations: Secondary | ICD-10-CM | POA: Insufficient documentation

## 2019-11-08 DIAGNOSIS — F329 Major depressive disorder, single episode, unspecified: Secondary | ICD-10-CM

## 2019-11-08 DIAGNOSIS — E876 Hypokalemia: Secondary | ICD-10-CM | POA: Insufficient documentation

## 2019-11-08 DIAGNOSIS — F32A Depression, unspecified: Secondary | ICD-10-CM

## 2019-11-08 HISTORY — DX: Depression, unspecified: F32.A

## 2019-11-08 LAB — COMPREHENSIVE METABOLIC PANEL
ALT: 20 U/L (ref 0–44)
AST: 21 U/L (ref 15–41)
Albumin: 3.8 g/dL (ref 3.5–5.0)
Alkaline Phosphatase: 51 U/L (ref 38–126)
Anion gap: 11 (ref 5–15)
BUN: 10 mg/dL (ref 6–20)
CO2: 26 mmol/L (ref 22–32)
Calcium: 9.1 mg/dL (ref 8.9–10.3)
Chloride: 102 mmol/L (ref 98–111)
Creatinine, Ser: 0.94 mg/dL (ref 0.61–1.24)
GFR calc Af Amer: >60 mL/min (ref >60)
GFR calc non Af Amer: >60 mL/min (ref >60)
Glucose, Bld: 124 mg/dL — ABNORMAL HIGH (ref 70–99)
Potassium: 3.2 mmol/L — ABNORMAL LOW (ref 3.5–5.1)
Sodium: 139 mmol/L (ref 135–145)
Total Bilirubin: 0.8 mg/dL (ref 0.3–1.2)
Total Protein: 6.4 g/dL — ABNORMAL LOW (ref 6.5–8.1)

## 2019-11-08 LAB — ACETAMINOPHEN LEVEL: Acetaminophen (Tylenol), Serum: <10 ug/mL — ABNORMAL LOW (ref 10–30)

## 2019-11-08 LAB — RAPID URINE DRUG SCREEN, HOSP PERFORMED
Amphetamines: POSITIVE — AB
Barbiturates: NOT DETECTED
Benzodiazepines: NOT DETECTED
Cocaine: NOT DETECTED
Opiates: NOT DETECTED
Tetrahydrocannabinol: POSITIVE — AB

## 2019-11-08 LAB — CBC
HCT: 46.9 % (ref 39.0–52.0)
Hemoglobin: 15.2 g/dL (ref 13.0–17.0)
MCH: 30 pg (ref 26.0–34.0)
MCHC: 32.4 g/dL (ref 30.0–36.0)
MCV: 92.7 fL (ref 80.0–100.0)
Platelets: 261 10*3/uL (ref 150–400)
RBC: 5.06 MIL/uL (ref 4.22–5.81)
RDW: 12.4 % (ref 11.5–15.5)
WBC: 4.9 10*3/uL (ref 4.0–10.5)
nRBC: 0 % (ref 0.0–0.2)

## 2019-11-08 LAB — SALICYLATE LEVEL: Salicylate Lvl: <7 mg/dL — ABNORMAL LOW (ref 7.0–30.0)

## 2019-11-08 LAB — ETHANOL: Alcohol, Ethyl (B): <10 mg/dL (ref <10)

## 2019-11-08 MED ORDER — POTASSIUM CHLORIDE CRYS ER 20 MEQ PO TBCR
40.0000 meq | EXTENDED_RELEASE_TABLET | Freq: Once | ORAL | Status: AC
  Administered 2019-11-08: 40 meq via ORAL
  Filled 2019-11-08: qty 2

## 2019-11-08 MED ORDER — SERTRALINE HCL 50 MG PO TABS
50.0000 mg | ORAL_TABLET | Freq: Every day | ORAL | Status: DC
  Filled 2019-11-08 (×2): qty 1

## 2019-11-08 NOTE — Progress Notes (Signed)
Orthopedic Tech Progress Note Patient Details:  Christopher [name] 25-Dec-2000 [id number]  Ortho Devices Type of Ortho Device: Ulna gutter splint Ortho Device/Splint Interventions: Application   Post Interventions Patient Tolerated: Well   [name] 11/08/2019, 2:30 PM

## 2019-11-08 NOTE — BH Assessment (Signed)
Tele Assessment Note   Patient Name: Christopher [name] MRN: [id number] Referring Physician: Nuala Alpha, PA-C Location of Patient: [facility] Location of Provider: [place] is a 19 y.o. male who presented to [facility] on voluntary basis with complaint of despondency.  Pt lives in [place] with his parents and brother; he is unemployed.  Pt treated at [facility] in January 2021 due to depression and paranoia.  Pt is not followed by an outpatient provider.  Pt stated that he came to the hospital today because he feels sad and wanted to speak with someone.  Per hospital notes, Pt stated that he was suicidal with plan to overdose and also homicidal without specific plan.  Pt denied this to author, stating that he was explaining to the nurse that he was previously suicidal and homicidal a few months ago, not today.  Pt stated that he is running out of Zoloft and is not sure how to get a refill.  Pt endorsed suicidal ideation, homicidal ideation, hallucination, and self-injurious behavior.  Pt endorsed despondency and irritability.  Pt also endorsed daily use of marijuana and recent use of Ecstasy.  Pt stated that he wanted to be discharged.  ''I talked to the nurse, and I feel better.''  During assessment, Pt presented as alert and oriented.  He had good eye contact and was cooperative.  Pt was dressed in street clothes, and he appeared appropriately groomed.  Pt's mood was sad.  Affect was pleasant.  Pt's speech was normal in rate, rhythm, and volume.  Thought processes were within normal range, and thought content was logical and goal-oriented.  There was no evidence of delusion.  Pt's memory and concentration were intact.  Insight, judgment, and impulse control were fair.  Consulted with Thedora Hinders, NP, who determined that Pt does not meet inpatient criteria and may be psych-cleared.  Diagnosis: Major Depressive Disorder, Recurrent, Moderate  Past Medical History:  Past Medical  History:  Diagnosis Date  . Depression     History reviewed. No pertinent surgical history.  Family History: No family history on file.  Social History:  reports that he has been smoking cigars. He has never used smokeless tobacco. He reports current drug use. Frequency: 7.00 times per week. Drugs: Marijuana and MDMA (Ecstacy). He reports that he does not drink alcohol.  Additional Social History:  Alcohol / Drug Use Pain Medications: See MAR Prescriptions: See MAR Over the Counter: See MAR History of alcohol / drug use?: Yes Substance #1 Name of Substance 1: Marijuana 1 - Amount (size/oz): Varied 1 - Frequency: Daily 1 - Duration: Ongoing 1 - Last Use / Amount: 11/07/19 Substance #2 Name of Substance 2: Ecstasy 2 - Amount (size/oz): Varied 2 - Frequency: episodic 2 - Duration: Ongoing 2 - Last Use / Amount: 11/07/19  CIWA: CIWA-Ar BP: 124/67 Pulse Rate: 76 COWS:    Allergies:  Allergies  Allergen Reactions  . Seroquel [Quetiapine] Nausea And Vomiting  . Vistaril [Hydroxyzine] Nausea And Vomiting  . Zoloft [Sertraline] Nausea And Vomiting    Home Medications: (Not in a hospital admission)   OB/GYN Status:  No LMP for male patient.  General Assessment Data Location of Assessment: [facility] ED TTS Assessment: In system Is this a Tele or Face-to-Face Assessment?: Tele Assessment Is this an Initial Assessment or a Re-assessment for this encounter?: Initial Assessment Patient Accompanied by:: N/A Language Other than English: No Living Arrangements: Other (Comment) What gender do you identify as?: Male Marital status: Single Pregnancy  Status: No Living Arrangements: Parent, Other relatives Can pt return to current living arrangement?: Yes Admission Status: Voluntary Is patient capable of signing voluntary admission?: Yes Referral Source: Self/Family/Friend Insurance type: [place] M[employer]     Crisis Care Plan Living Arrangements: Parent, Other relatives Name of  Psychiatrist: None currently Name of Therapist: None currently  Education Status Is patient currently in school?: No Is the patient employed, unemployed or receiving disability?: Unemployed  Risk to self with the past 6 months Suicidal Ideation: No-Not Currently/Within Last 6 Months(See notes) Has patient been a risk to self within the past 6 months prior to admission? : Yes Suicidal Intent: No Has patient had any suicidal intent within the past 6 months prior to admission? : No Is patient at risk for suicide?: Yes(See notes) Suicidal Plan?: No-Not Currently/Within Last 6 Months(See notes) Has patient had any suicidal plan within the past 6 months prior to admission? : No Access to Means: Yes Specify Access to Suicidal Means: Pills What has been your use of drugs/alcohol within the last 12 months?: Marijuana, Ecstasy Previous Attempts/Gestures: Yes Other Self Harm Risks: Drug use Intentional Self Injurious Behavior: None Recent stressful life event(s): Other (Comment)(Running out of medication) Depression: Yes Depression Symptoms: Despondent, Feeling worthless/self pity, Feeling angry/irritable Substance abuse history and/or treatment for substance abuse?: Yes Suicide prevention information given to non-admitted patients: Not applicable  Risk to Others within the past 6 months Homicidal Ideation: No Does patient have any lifetime risk of violence toward others beyond the six months prior to admission? : No Thoughts of Harm to Others: No Current Homicidal Intent: No Current Homicidal Plan: No Access to Homicidal Means: No History of harm to others?: No Criminal Charges Pending?: Yes Describe Pending Criminal Charges: Mis. Larceny Does patient have a court date: Yes Court Date: 11/17/19 Is patient on probation?: Unknown  Psychosis Hallucinations: None noted Delusions: None noted  Mental Status Report Appearance/Hygiene: Unremarkable, Other (Comment)(Street clothes) Eye  Contact: Good Motor Activity: Freedom of movement, Unremarkable Speech: Logical/coherent Level of Consciousness: Alert Mood: Sad, Pleasant Affect: Appropriate to circumstance Anxiety Level: None Thought Processes: Relevant, Coherent Judgement: Partial Orientation: Person, Place, Time, Situation Obsessive Compulsive Thoughts/Behaviors: None  Cognitive Functioning Concentration: Good Memory: Recent Intact, Remote Intact Is patient IDD: No Insight: Fair Impulse Control: Fair Appetite: Fair Sleep: No Change Total Hours of Sleep: 8  ADLScreening [facility] Assessment Services) Patient's cognitive ability adequate to safely complete daily activities?: Yes Patient able to express need for assistance with ADLs?: Yes Independently performs ADLs?: Yes (appropriate for developmental age)  Prior Inpatient Therapy Prior Inpatient Therapy: Yes Prior Therapy Dates: January 2021 Prior Therapy Facilty/Provider(s): [facility] Reason for Treatment: Depression  Prior Outpatient Therapy Prior Outpatient Therapy: No Does patient have an ACCT team?: No Does patient have Intensive In-House Services?  : No Does patient have [facility] services? : No Does patient have P4CC services?: No  ADL Screening (condition at time of admission) Patient's cognitive ability adequate to safely complete daily activities?: Yes Is the patient deaf or have difficulty hearing?: No Does the patient have difficulty seeing, even when wearing glasses/contacts?: No Does the patient have difficulty concentrating, remembering, or making decisions?: No Patient able to express need for assistance with ADLs?: Yes Does the patient have difficulty dressing or bathing?: No Independently performs ADLs?: Yes (appropriate for developmental age) Does the patient have difficulty walking or climbing stairs?: No Weakness of Legs: None Weakness of Arms/Hands: None  Home Assistive Devices/Equipment Home Assistive Devices/Equipment:  None  Therapy Consults (therapy  consults require a physician order) PT Evaluation Needed: No OT Evalulation Needed: No SLP Evaluation Needed: No Abuse/Neglect Assessment (Assessment to be complete while patient is alone) Abuse/Neglect Assessment Can Be Completed: Yes Physical Abuse: Denies Verbal Abuse: Denies Sexual Abuse: Denies Exploitation of patient/patient's resources: Denies Self-Neglect: Denies Values / Beliefs Cultural Requests During Hospitalization: None Spiritual Requests During Hospitalization: None Consults Spiritual Care Consult Needed: No Transition of Care Team Consult Needed: No Advance Directives (For Healthcare) Does Patient Have a Medical Advance Directive?: No          Disposition:  Disposition Initial Assessment Completed for this Encounter: Yes Disposition of Patient: Discharge(Per Molli Knock, NP, Pt does not meet inpt criteria)  This service was provided via telemedicine using a 2-way, interactive audio and video technology.  Names of all persons participating in this telemedicine service and their role in this encounter. Name: [facility] Role: Patient             Earline Mayotte 11/08/2019 2:57 PM

## 2019-11-08 NOTE — ED Triage Notes (Signed)
Pt states he is suicidal with a plan to "take pills".  Also states he is homicidal without a specific plan.

## 2019-11-08 NOTE — ED Notes (Signed)
Per Apolinar Junes PA, ulnar splint right wrist

## 2019-11-08 NOTE — ED Provider Notes (Addendum)
[facility] EMERGENCY DEPARTMENT Provider Note   CSN: [id number] Arrival date & time: 11/08/19  1127     History Chief Complaint  Patient presents with  . Suicidal    Christopher [name] is a 19 y.o. male history of depression, anxiety.  Patient presents today with his mother for depression.  He reported to the triage nurse that he had a plan of suicide and to "take pills and ", there is also a note regarding a nonspecific homicidal ideation.  Prior to my initial evaluation patient reports that he had a "good talk" with a staff member and his mother and is feeling much better.  Currently patient denies any suicidal or homicidal ideations, he reports that he is feeling well, he would still like to talk with TTS today.  Patient's only other concern today is that he would like a new right wrist splint, he reports he had a fall earlier this month, was evaluated the ED and given a splint which she has since lost.  He denies any new injury of his right wrist, describes mild aching sensation to the ulnar portion of his right wrist worse with movement improved with the splint, nonradiating.  Denies recent illness, fever/chills, trauma/injury, headache, neck pain, chest pain/shortness of breath, abdominal pain, nausea/vomiting, diarrhea, cough, numbness/weakness, tingling, ingestion, visual/auditory hallucinations, SI/HI or any additional concerns. HPI     Past Medical History:  Diagnosis Date  . Depression     Patient Active Problem List   Diagnosis Date Noted  . Adjustment disorder with mixed anxiety and depressed mood 08/03/2019  . Homicidal ideations 08/03/2019  . Acute adjustment disorder with mixed anxiety and depressed mood 08/03/2019  . MDD (major depressive disorder), single episode, severe with psychosis ([place]) 08/03/2019    History reviewed. No pertinent surgical history.     No family history on file.  Social History   Tobacco Use  . Smoking status:  Current Every Day Smoker    Types: Cigars  . Smokeless tobacco: Never Used  Substance Use Topics  . Alcohol use: No  . Drug use: Yes    Frequency: 7.0 times per week    Types: Marijuana, MDMA (Ecstacy)    Comment: UDS +THC, AMPH    Home Medications Prior to Admission medications   Medication Sig Start Date End Date Taking? Authorizing Provider  QUEtiapine (SEROQUEL) 100 MG tablet Take 1 tablet (100 mg total) by mouth at bedtime. For mood control Patient not taking: Reported on 10/21/2019 08/06/19 10/21/19  Lindell Spar I, NP  sertraline (ZOLOFT) 50 MG tablet Take 1 tablet (50 mg total) by mouth daily. For depression Patient not taking: Reported on 10/21/2019 08/07/19 10/21/19  Lindell Spar I, NP    Allergies    Seroquel [quetiapine], Vistaril [hydroxyzine], and Zoloft [sertraline]  Review of Systems   Review of Systems Ten systems are reviewed and are negative for acute change except as noted in the HPI  Physical Exam Updated Vital Signs BP 124/67 (BP Location: Right Arm)   Pulse 76   Temp 98.6 F (37 C) (Oral)   Resp 16   SpO2 100%   Physical Exam Constitutional:      General: He is not in acute distress.    Appearance: Normal appearance. He is well-developed. He is not ill-appearing or diaphoretic.  HENT:     Head: Normocephalic and atraumatic.     Right Ear: External ear normal.     Left Ear: External ear normal.     Nose:  Nose normal.  Eyes:     General: Vision grossly intact. Gaze aligned appropriately.     Pupils: Pupils are equal, round, and reactive to light.  Neck:     Trachea: Trachea and phonation normal. No tracheal deviation.  Cardiovascular:     Rate and Rhythm: Normal rate and regular rhythm.     Pulses: Normal pulses.  Pulmonary:     Effort: Pulmonary effort is normal. No respiratory distress.  Abdominal:     General: There is no distension.     Palpations: Abdomen is soft.     Tenderness: There is no abdominal tenderness. There is no guarding or  rebound.  Musculoskeletal:        General: Normal range of motion.     Right elbow: Normal.     Left elbow: Normal.     Right forearm: Normal.     Left forearm: Normal.     Right wrist: Normal.     Left wrist: Normal.     Right hand: Normal.     Left hand: Normal.     Cervical back: Normal range of motion.     Comments: Right hand: No gross deformities, skin intact. Fingers appear normal. No snuffbox tenderness to palpation. No tenderness to palpation over flexor sheath.  Finger adduction/abduction intact with 5/5 strength.  Thumb opposition intact. Full active and resisted ROM to flexion/extension at wrist, MCP, PIP and DIP of all fingers.  FDS/FDP intact. Grip 5/5 strength.  Radial artery 2+ with <2sec cap refill in all fingers.  Sensation intact to light-tough in median/ulnar/radial distributions.  Minimal pain at the ulnar wrist with radial deviation.  Skin:    General: Skin is warm and dry.  Neurological:     Mental Status: He is alert.     GCS: GCS eye subscore is 4. GCS verbal subscore is 5. GCS motor subscore is 6.     Comments: Speech is clear and goal oriented, follows commands Major Cranial nerves without deficit, no facial droop Moves extremities without ataxia, coordination intact  Psychiatric:        Attention and Perception: He does not perceive auditory or visual hallucinations.        Mood and Affect: Mood normal.        Speech: Speech normal.        Behavior: Behavior normal. Behavior is cooperative.        Thought Content: Thought content does not include homicidal or suicidal ideation.     ED Results / Procedures / Treatments   Labs (all labs ordered are listed, but only abnormal results are displayed) Labs Reviewed  COMPREHENSIVE METABOLIC PANEL - Abnormal; Notable for the following components:      Result Value   Potassium 3.2 (*)    Glucose, Bld 124 (*)    Total Protein 6.4 (*)    All other components within normal limits  SALICYLATE LEVEL - Abnormal;  Notable for the following components:   Salicylate Lvl <7.0 (*)    All other components within normal limits  ACETAMINOPHEN LEVEL - Abnormal; Notable for the following components:   Acetaminophen (Tylenol), Serum <10 (*)    All other components within normal limits  RAPID URINE DRUG SCREEN, HOSP PERFORMED - Abnormal; Notable for the following components:   Amphetamines POSITIVE (*)    Tetrahydrocannabinol POSITIVE (*)    All other components within normal limits  ETHANOL  CBC    EKG None  Radiology No results found.  Procedures Procedures (including  critical care time)  Medications Ordered in ED Medications  potassium chloride SA (KLOR-CON) CR tablet 40 mEq (has no administration in time range)    ED Course  I have reviewed the triage vital signs and the nursing notes.  Pertinent labs & imaging results that were available during my care of the patient were reviewed by me and considered in my medical decision making (see chart for details).    MDM Rules/Calculators/A&P                     Chart reviewed, patient seen on 10/20/2019 for heat syncope, fall and right hand pain.  Right hand x-ray at that time read as negative by radiologist.  I have personally reviewed patient's hand x-ray, agree with radiologist interpretation. - As patient reports that his pain has improved but he still has lingering occasional pain with certain movements he is requesting a splint today will give patient splint here in the ER.  He has no deformity or swelling is neurovascular intact with full range of motion appropriate strength with all movements.  No evidence of cellulitis, septic arthritis, DVT, compartment syndrome or other emergent pathologies.  Will replace splint here and referred to hand for follow-up as needed, no indication for further work-up regarding her right hand here in the ER. - ED triage psych labs personally reviewed and interpreted.  CBC within normal limits no leukocytosis to  suggest infection and no evidence of anemia.  Ethanol negative, patient does not appear to be in withdrawal or intoxicated.  Salicylate and Tylenol level are negative no indication for ingestion of these substances additionally patient denies any ingestions today.  UDS positive for amphetamines and THC, again patient does not appear intoxicated.  CMP shows mild hypokalemia with potassium 3.2, no emergent electrolyte derangement, evidence of kidney injury or acute elevation of LFTs. Will give patient K-Dur and have follow-up with PCP.  - On my evaluation patient is fully alert and oriented well-appearing no acute distress, he reports he is not having any SI or HI at this time he spoke with a staff member earlier and has been feeling well since that time.  He would still like to talk to TTS, I have placed a consult.   At this time there does not appear to be any evidence of an acute emergency medical condition and patient is medically cleared for TTS evaluation. ============= ============= Addendum 3:27 PM: Informed by nurse that patient has been cleared by TTS.  Review of [facility] note from Reather Laurence shows that patient is cleared for discharge and does not meet inpatient criteria.  At this time there does not appear to be any evidence of an acute emergency medical condition and the patient appears stable for discharge with appropriate outpatient follow up. Diagnosis was discussed with patient who verbalizes understanding of care plan and is agreeable to discharge. I have discussed return precautions with patient and mother who verbalizes understanding. Patient encouraged to follow-up with their PCP. All questions answered.  Patient's case discussed with Dr. Clarene Duke who agrees with plan to discharge with follow-up.   Note: Portions of this report may have been transcribed using voice recognition software. Every effort was made to ensure accuracy; however, inadvertent computerized transcription errors may  still be present.  Note: Portions of this report may have been transcribed using voice recognition software. Every effort was made to ensure accuracy; however, inadvertent computerized transcription errors may still be present. Final Clinical Impression(s) / ED Diagnoses Final  diagnoses:  Depression, unspecified depression type  Hypokalemia  Right hand pain    Rx / DC Orders ED Discharge Orders    None       Elizabeth Palau 11/08/19 1430    Elizabeth Palau 11/08/19 1537    Little, Ambrose Finland, MD 11/09/19 [phone]

## 2019-11-08 NOTE — Discharge Instructions (Addendum)
At this time there does not appear to be the presence of an emergent medical condition, however there is always the potential for conditions to change. Please read and follow the below instructions.  Please return to the Emergency Department immediately for any new or worsening symptoms. Please be sure to follow up with your Primary Care Provider within one week regarding your visit today; please call their office to schedule an appointment even if you are feeling better for a follow-up visit. Have your primary care provider recheck your potassium level at your next visit.  Please eat a healthy diet and drink enough water. Call the hand specialist regarding your right hand pain for further follow-up as needed. You have any new/concerning or worsening of symptoms  Get help right away if: You self-harm. You see, hear, taste, smell, or feel things that are not present (hallucinate). Have chest pain. Have shortness of breath. Have vomiting or diarrhea that lasts for more than 2 days. Faint. You have any new/concerning or worsening of symptoms  Please read the additional information packets attached to your discharge summary.  Do not take your medicine if  develop an itchy rash, swelling in your mouth or lips, or difficulty breathing; call 911 and seek immediate emergency medical attention if this occurs.  Note: Portions of this text may have been transcribed using voice recognition software. Every effort was made to ensure accuracy; however, inadvertent computerized transcription errors may still be present.

## 2019-11-08 NOTE — ED Notes (Signed)
Discharge papers given to patient and mother at his side.  Advised to call [facility] to establish care so that prescription for refill on zoloft can be secured.  Pt states he has Zoloft at home and has been taking.   Advised to discontinue use of illicit drugs.  Pt verbalized understanding.

## 2019-11-08 NOTE — ED Notes (Signed)
Pt sitting on hallway bed talking to tech.  Pt denies SI/HI at this time.  When asked what brought him to ED, he states "I have been down.  I don't have a job".  Pt states "since I talked to that lady I feel better about things".

## 2020-04-19 ENCOUNTER — Emergency Department (HOSPITAL_COMMUNITY)
Admission: EM | Admit: 2020-04-19 | Discharge: 2020-04-19 | Disposition: A | Discharge status: Home / Self Care | Payer: Medicaid Other | Attending: Emergency Medicine | Admitting: Emergency Medicine

## 2020-04-19 DIAGNOSIS — K0889 Other specified disorders of teeth and supporting structures: Secondary | ICD-10-CM | POA: Diagnosis not present

## 2020-04-19 DIAGNOSIS — R519 Headache, unspecified: Secondary | ICD-10-CM | POA: Diagnosis not present

## 2020-04-19 DIAGNOSIS — Z5321 Procedure and treatment not carried out due to patient leaving prior to being seen by health care provider: Secondary | ICD-10-CM | POA: Diagnosis not present

## 2020-04-19 NOTE — ED Notes (Signed)
Pt did not respond when called for treatment area X3 

## 2020-04-19 NOTE — ED Triage Notes (Signed)
Pt reports bilateral upper and lower molar pain for 1-2 years. Causing headache

## 2021-03-20 ENCOUNTER — Other Ambulatory Visit: Payer: Self-pay

## 2021-03-20 ENCOUNTER — Encounter (HOSPITAL_COMMUNITY): Payer: Self-pay

## 2021-03-20 ENCOUNTER — Emergency Department (HOSPITAL_COMMUNITY)
Admission: EM | Admit: 2021-03-20 | Discharge: 2021-03-20 | Disposition: A | Discharge status: Home / Self Care | Payer: Medicaid Other | Attending: Emergency Medicine | Admitting: Emergency Medicine

## 2021-03-20 DIAGNOSIS — X58XXXA Exposure to other specified factors, initial encounter: Secondary | ICD-10-CM | POA: Insufficient documentation

## 2021-03-20 DIAGNOSIS — S025XXA Fracture of tooth (traumatic), initial encounter for closed fracture: Secondary | ICD-10-CM | POA: Diagnosis not present

## 2021-03-20 DIAGNOSIS — K047 Periapical abscess without sinus: Secondary | ICD-10-CM

## 2021-03-20 DIAGNOSIS — F17210 Nicotine dependence, cigarettes, uncomplicated: Secondary | ICD-10-CM | POA: Insufficient documentation

## 2021-03-20 DIAGNOSIS — S0993XA Unspecified injury of face, initial encounter: Secondary | ICD-10-CM | POA: Diagnosis present

## 2021-03-20 MED ORDER — HYDROCODONE-ACETAMINOPHEN 5-325 MG PO TABS
1.0000 | ORAL_TABLET | Freq: Once | ORAL | Status: AC
  Administered 2021-03-20: 1 via ORAL
  Filled 2021-03-20: qty 1

## 2021-03-20 MED ORDER — AMOXICILLIN-POT CLAVULANATE 875-125 MG PO TABS: 1.0000 | ORAL_TABLET | Freq: Two times a day (BID) | ORAL | 0 refills | Status: AC

## 2021-03-20 MED ORDER — AMOXICILLIN-POT CLAVULANATE 875-125 MG PO TABS
1.0000 | ORAL_TABLET | Freq: Once | ORAL | Status: AC
  Administered 2021-03-20: 1 via ORAL
  Filled 2021-03-20: qty 1

## 2021-03-20 MED ORDER — HYDROCODONE-ACETAMINOPHEN 5-325 MG PO TABS: 2.0000 | ORAL_TABLET | ORAL | 0 refills | Status: AC | PRN

## 2021-03-20 MED ORDER — IBUPROFEN 600 MG PO TABS
600.0000 mg | ORAL_TABLET | Freq: Four times a day (QID) | ORAL | 0 refills | Status: AC | PRN
Start: 2021-03-20 — End: ?

## 2021-03-20 NOTE — ED Provider Notes (Signed)
[facility]-EMERGENCY DEPT Provider Note   CSN: [id number] Arrival date & time: 03/20/21  1011     History Chief Complaint  Patient presents with   Dental Pain    Christopher [name] is a 20 y.o. male.  Patient is a 20 yo male presenting for dental pain. Patient admits to poor dentition with intermittent pain/infections x 1 year. States he has not followed with dentist yet-does not have insurance. Has worsening left lower tooth pain with gum drainage that he noticed last night with foul taste. Denies fevers, chills, nausea, vomiting, or malaise. Denies difficulty breath, swallowing, or tongue swelling.   The history is provided by the patient. No language interpreter was used.  Dental Pain Location:  Lower Quality:  Aching Severity:  Moderate Onset quality:  Gradual Timing:  Constant Associated symptoms: no drooling and no fever       Past Medical History:  Diagnosis Date   Depression     Patient Active Problem List   Diagnosis Date Noted   Adjustment disorder with mixed anxiety and depressed mood 08/03/2019   Homicidal ideations 08/03/2019   Acute adjustment disorder with mixed anxiety and depressed mood 08/03/2019   MDD (major depressive disorder), single episode, severe with psychosis ([facility]) 08/03/2019    History reviewed. No pertinent surgical history.     Family History  Problem Relation Age of Onset   Diabetes Mother    Diabetes Father     Social History   Tobacco Use   Smoking status: Every Day    Types: Cigars   Smokeless tobacco: Never  Vaping Use   Vaping Use: Never used  Substance Use Topics   Alcohol use: No   Drug use: Yes    Frequency: 7.0 times per week    Types: Marijuana    Home Medications Prior to Admission medications   Medication Sig Start Date End Date Taking? Authorizing Provider  QUEtiapine (SEROQUEL) 100 MG tablet Take 1 tablet (100 mg total) by mouth at bedtime. For mood control Patient not taking: Reported  on 10/21/2019 08/06/19 10/21/19  Armandina Stammer I, NP  sertraline (ZOLOFT) 50 MG tablet Take 1 tablet (50 mg total) by mouth daily. For depression Patient not taking: Reported on 10/21/2019 08/07/19 10/21/19  Armandina Stammer I, NP    Allergies    Seroquel [quetiapine], Vistaril [hydroxyzine], and Zoloft [sertraline]  Review of Systems   Review of Systems  Constitutional:  Negative for chills and fever.  HENT:  Positive for dental problem. Negative for drooling, ear pain, sore throat, trouble swallowing and voice change.   Eyes:  Negative for pain and visual disturbance.  Respiratory:  Negative for cough and shortness of breath.   Cardiovascular:  Negative for chest pain and palpitations.  Gastrointestinal:  Negative for nausea and vomiting.  All other systems reviewed and are negative.  Physical Exam Updated Vital Signs BP 124/75 (BP Location: Left Arm)   Pulse (!) 104   Temp 98.4 F (36.9 C) (Oral)   Resp 14   Ht 6\' 2"  (1.88 m)   Wt 81.6 kg   SpO2 99%   BMI 23.11 kg/m   Physical Exam Vitals and nursing note reviewed.  Constitutional:      Appearance: Normal appearance.  HENT:     Head: Normocephalic and atraumatic.     Nose: Nose normal.     Mouth/Throat:     Mouth: Mucous membranes are moist.   Cardiovascular:     Rate and Rhythm: Normal rate and  regular rhythm.  Pulmonary:     Effort: Pulmonary effort is normal. No respiratory distress.  Neurological:     General: No focal deficit present.     Mental Status: He is alert and oriented to person, place, and time.    ED Results / Procedures / Treatments   Labs (all labs ordered are listed, but only abnormal results are displayed) Labs Reviewed - No data to display  EKG None  Radiology No results found.  Procedures Procedures   Medications Ordered in ED Medications - No data to display  ED Course  I have reviewed the triage vital signs and the nursing notes.  Pertinent labs & imaging results that were available  during my care of the patient were reviewed by me and considered in my medical decision making (see chart for details).    MDM Rules/Calculators/A&P                          10:34 AM 20 yo male presenting for dental pain. Pt is Aox3, no acute distress, afebrile, with stable vitals. No difficulty swallowing, drooling, changes in voice, or hypoxia. Low suspicion of any  life threatening etiologies. Physical exam demonstrates Rennis Harding type 2 fracture to tooth #30 with induration of gum tissue. No fluctuance for I/D. Pt states drainage began last night with foul taste. Antibiotics given in ED and mediations for pain control. Patient given list for dentists that accept no insurance.   Patient in no distress and overall condition improved here in the ED. Detailed discussions were had with the patient regarding current findings, and need for close f/u with PCP or on call doctor. The patient has been instructed to return immediately if the symptoms worsen in any way for re-evaluation. Patient verbalized understanding and is in agreement with current care plan. All questions answered prior to discharge.         Final Clinical Impression(s) / ED Diagnoses Final diagnoses:  Dental infection  Closed fracture of tooth, initial encounter    Rx / DC Orders ED Discharge Orders     None        Franne Forts, DO 03/20/21 1044

## 2021-03-20 NOTE — ED Triage Notes (Signed)
Patient c/o right lower dental pain x 2 days. Patient has slight swelling to the right lower jaw.

## 2021-09-20 IMAGING — CR DG HAND COMPLETE 3+V*R*
3 series · 3 of 3 positions shown · non-contrast
Comparison: October 05, 2014

CLINICAL DATA: Right hand pain.

EXAM:
RIGHT HAND - COMPLETE 3+ VIEW

[hand pa]
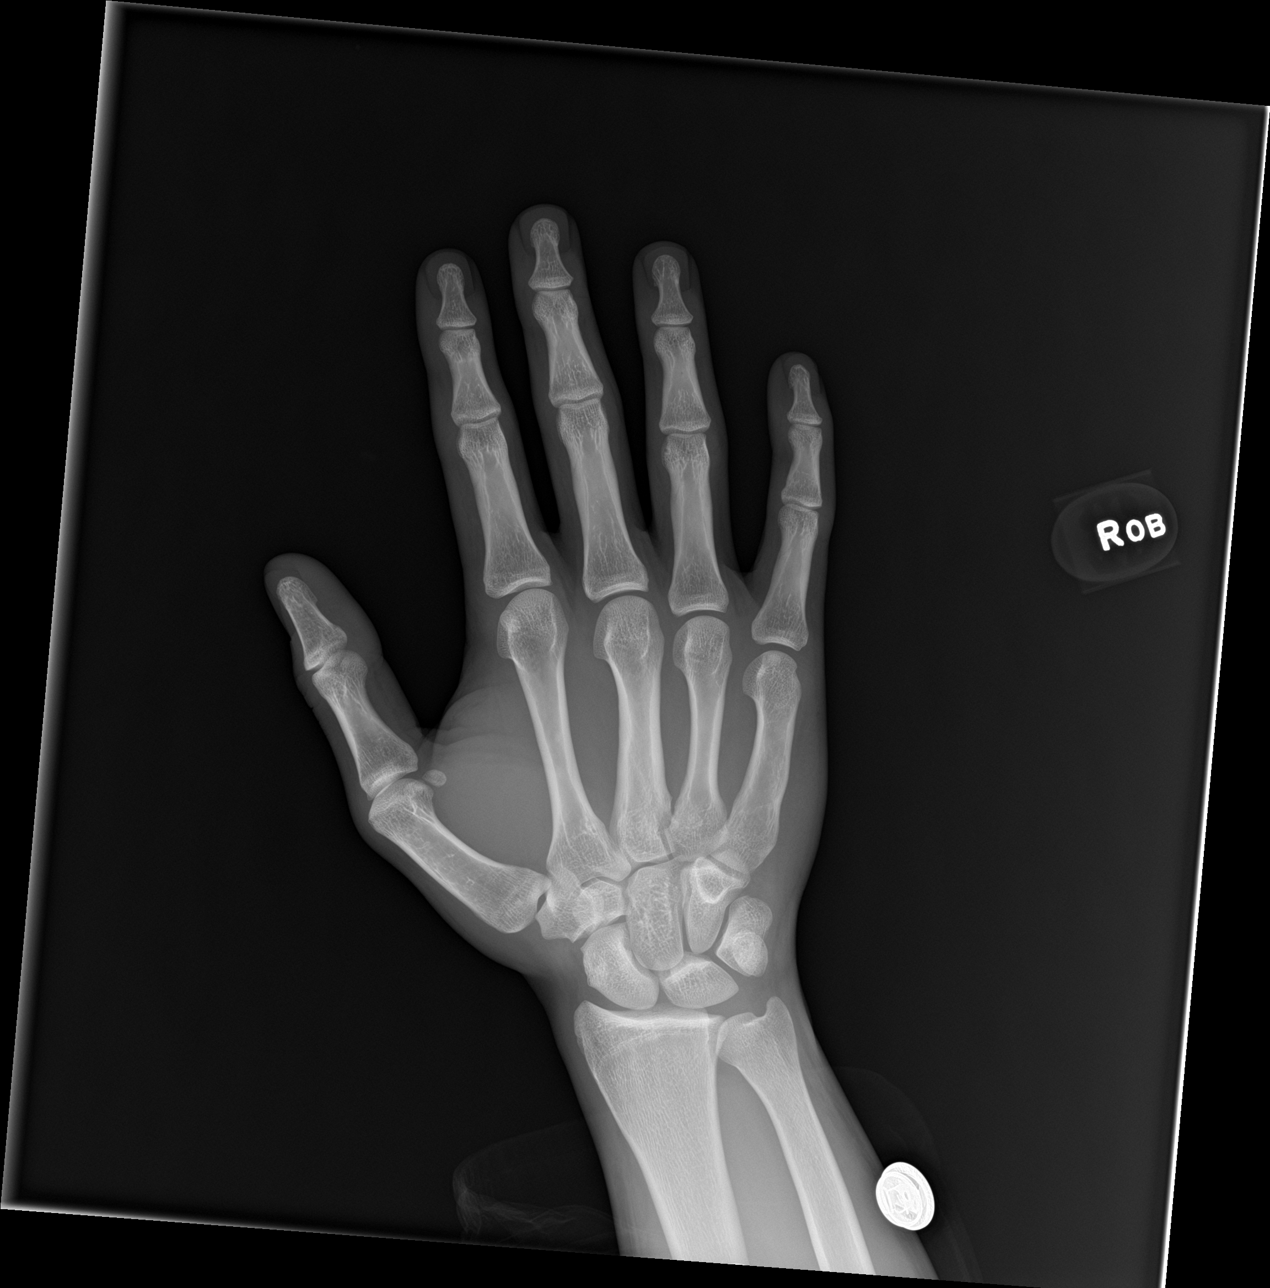

[hand obl]
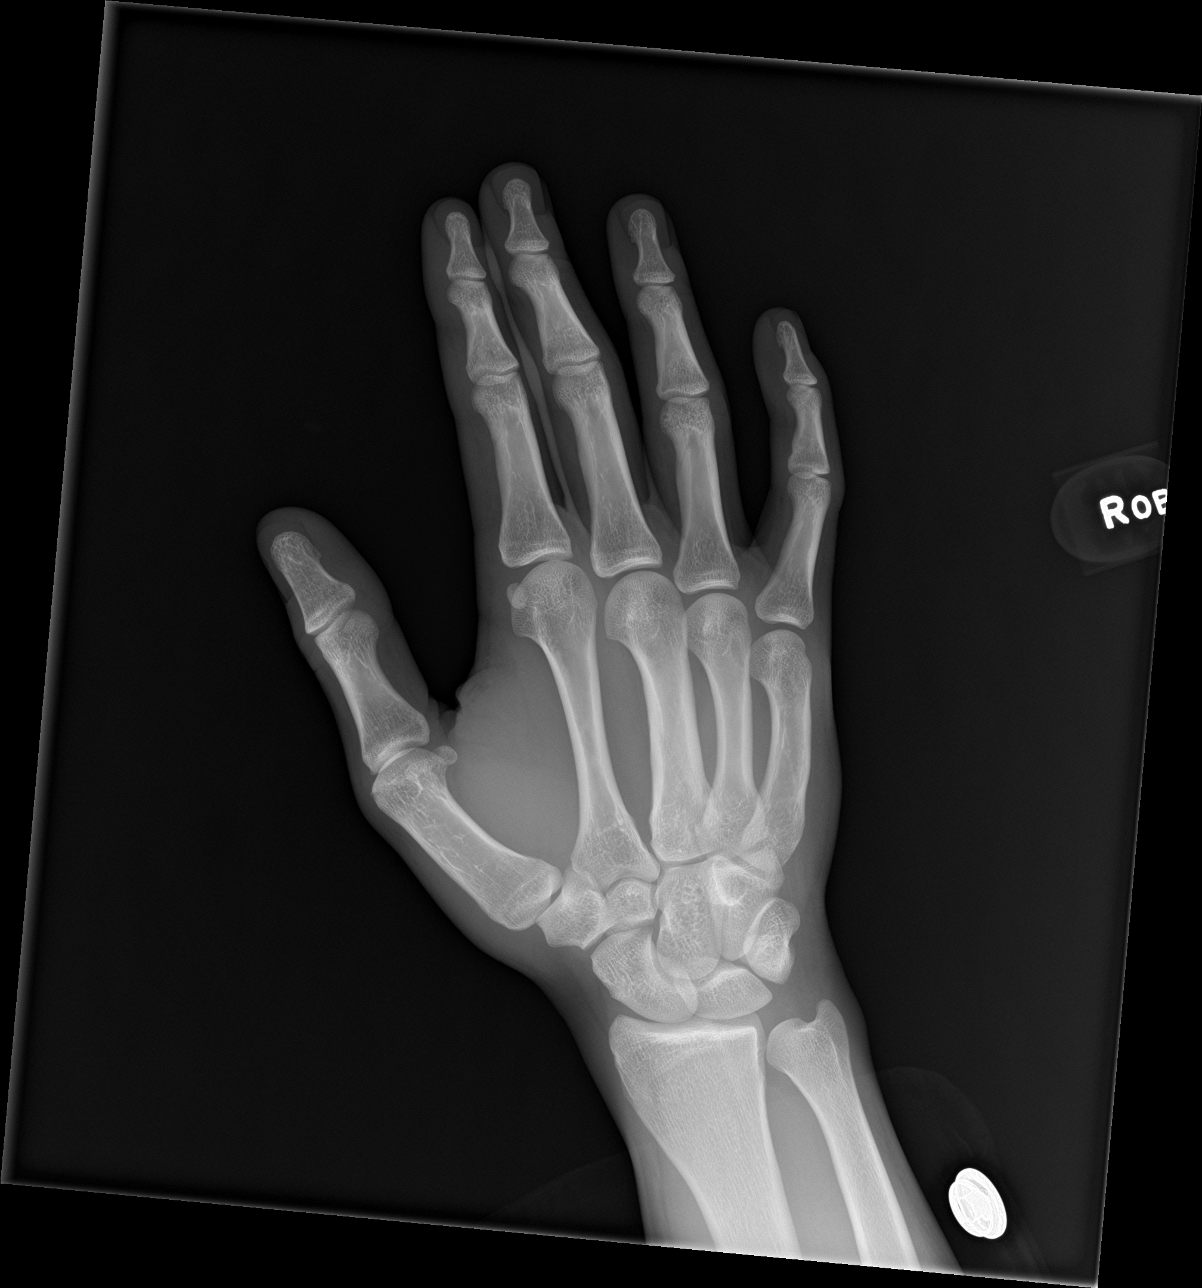

[hand lat]
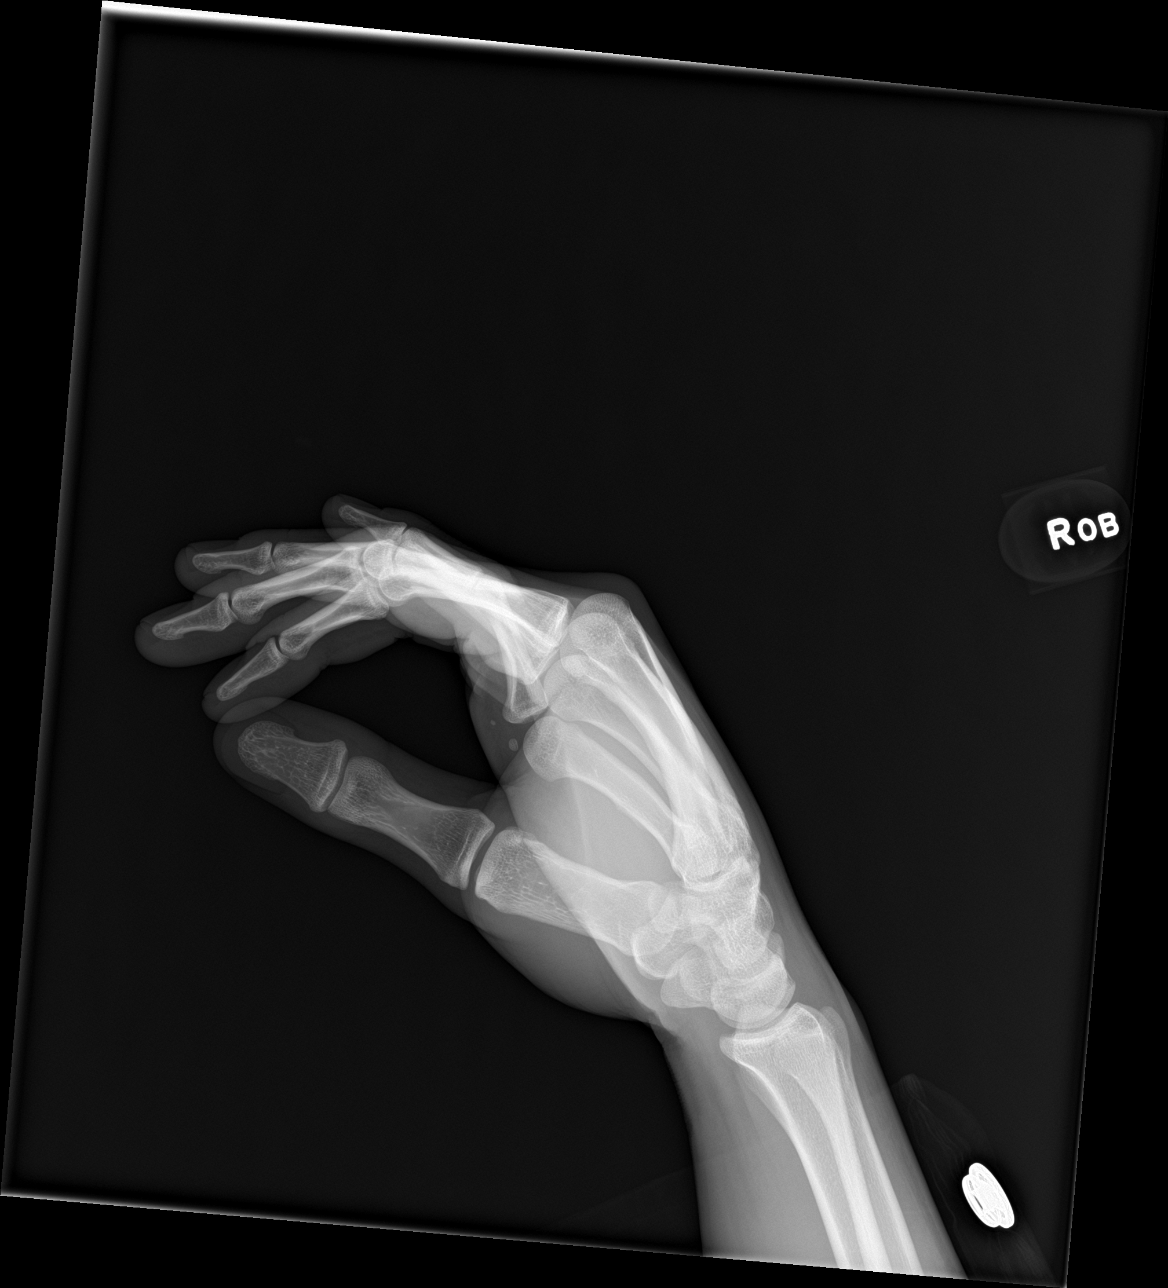

[3 of 3 positions shown; findings below may reference images not displayed]

FINDINGS: There is no evidence of fracture or dislocation. There is no
evidence of arthropathy or other focal bone abnormality. Soft
tissues are unremarkable.
IMPRESSION: Negative.

## 2022-03-06 ENCOUNTER — Ambulatory Visit (HOSPITAL_COMMUNITY): Payer: Medicaid Other

## 2023-04-20 ENCOUNTER — Other Ambulatory Visit: Payer: Self-pay

## 2023-04-20 ENCOUNTER — Encounter: Payer: Self-pay | Admitting: *Deleted

## 2023-04-20 ENCOUNTER — Ambulatory Visit (INDEPENDENT_AMBULATORY_CARE_PROVIDER_SITE_OTHER): Payer: Self-pay

## 2023-04-20 ENCOUNTER — Ambulatory Visit
Admission: EM | Admit: 2023-04-20 | Discharge: 2023-04-20 | Disposition: A | Discharge status: Home / Self Care | Payer: Self-pay | Attending: Physician Assistant | Admitting: Physician Assistant

## 2023-04-20 DIAGNOSIS — L03113 Cellulitis of right upper limb: Secondary | ICD-10-CM

## 2023-04-20 MED ORDER — AMOXICILLIN-POT CLAVULANATE 875-125 MG PO TABS: 1.0000 | ORAL_TABLET | Freq: Two times a day (BID) | ORAL | 0 refills | Status: AC
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
